# Patient Record
Sex: Male | Born: 1995
Health system: Southern US, Community
[De-identification: ages and names within clinical notes are randomized; demographics above are authoritative.]

## PROBLEM LIST (undated history)

## (undated) DIAGNOSIS — J302 Other seasonal allergic rhinitis: Secondary | ICD-10-CM

## (undated) DIAGNOSIS — J342 Deviated nasal septum: Secondary | ICD-10-CM

## (undated) DIAGNOSIS — J322 Chronic ethmoidal sinusitis: Secondary | ICD-10-CM

## (undated) DIAGNOSIS — J321 Chronic frontal sinusitis: Secondary | ICD-10-CM

## (undated) DIAGNOSIS — Z8489 Family history of other specified conditions: Secondary | ICD-10-CM

## (undated) DIAGNOSIS — Z98811 Dental restoration status: Secondary | ICD-10-CM

## (undated) DIAGNOSIS — J343 Hypertrophy of nasal turbinates: Secondary | ICD-10-CM

## (undated) DIAGNOSIS — H6690 Otitis media, unspecified, unspecified ear: Secondary | ICD-10-CM

## (undated) DIAGNOSIS — J320 Chronic maxillary sinusitis: Secondary | ICD-10-CM

## (undated) DIAGNOSIS — F819 Developmental disorder of scholastic skills, unspecified: Secondary | ICD-10-CM

## (undated) DIAGNOSIS — F909 Attention-deficit hyperactivity disorder, unspecified type: Secondary | ICD-10-CM

## (undated) DIAGNOSIS — J323 Chronic sphenoidal sinusitis: Secondary | ICD-10-CM

## (undated) HISTORY — PX: TYMPANOSTOMY TUBE PLACEMENT: SHX32

## (undated) HISTORY — PX: WISDOM TOOTH EXTRACTION: SHX21

---
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
## 2014-01-06 ENCOUNTER — Emergency Department (HOSPITAL_COMMUNITY)
Admission: EM | Admit: 2014-01-06 | Discharge: 2014-01-06 | Disposition: A | Discharge status: Home / Self Care | Payer: Worker's Compensation | Attending: Emergency Medicine | Admitting: Emergency Medicine

## 2014-01-06 ENCOUNTER — Encounter (HOSPITAL_COMMUNITY): Payer: Self-pay | Admitting: Emergency Medicine

## 2014-01-06 ENCOUNTER — Emergency Department (HOSPITAL_COMMUNITY): Payer: Worker's Compensation

## 2014-01-06 DIAGNOSIS — Y990 Civilian activity done for income or pay: Secondary | ICD-10-CM | POA: Diagnosis not present

## 2014-01-06 DIAGNOSIS — S8991XA Unspecified injury of right lower leg, initial encounter: Secondary | ICD-10-CM | POA: Diagnosis present

## 2014-01-06 DIAGNOSIS — W1839XA Other fall on same level, initial encounter: Secondary | ICD-10-CM | POA: Diagnosis not present

## 2014-01-06 DIAGNOSIS — Y9389 Activity, other specified: Secondary | ICD-10-CM | POA: Diagnosis not present

## 2014-01-06 DIAGNOSIS — Y9289 Other specified places as the place of occurrence of the external cause: Secondary | ICD-10-CM | POA: Insufficient documentation

## 2014-01-06 DIAGNOSIS — Z87891 Personal history of nicotine dependence: Secondary | ICD-10-CM | POA: Insufficient documentation

## 2014-01-06 DIAGNOSIS — X58XXXA Exposure to other specified factors, initial encounter: Secondary | ICD-10-CM | POA: Insufficient documentation

## 2014-01-06 DIAGNOSIS — M25461 Effusion, right knee: Secondary | ICD-10-CM | POA: Insufficient documentation

## 2014-01-06 MED ORDER — NAPROXEN 500 MG PO TABS: 500.0000 mg | ORAL_TABLET | Freq: Two times a day (BID) | ORAL | Status: DC

## 2014-01-06 NOTE — ED Provider Notes (Signed)
CSN: [id number]     Arrival date & time 01/06/14  2014 History   First MD Initiated Contact with Patient 01/06/14 2212     Chief Complaint  Patient presents with  . Knee Injury     (Consider location/radiation/quality/duration/timing/severity/associated sxs/prior Treatment) HPI Comments: Slipped at work twisting R knee at work just before arrival in ED  NO therapies PTA   The history is provided by the patient.    History reviewed. No pertinent past medical history. Past Surgical History  Procedure Laterality Date  . Shoulder surgery     No family history on file. History  Substance Use Topics  . Smoking status: Former Games developermoker  . Smokeless tobacco: Not on file  . Alcohol Use: Yes     Comment: occasionally     Review of Systems  Constitutional: Negative for fever.  Musculoskeletal: Positive for joint swelling.  Neurological: Negative for weakness and numbness.  All other systems reviewed and are negative.     Allergies  Review of patient's allergies indicates no known allergies.  Home Medications   Prior to Admission medications   Medication Sig Start Date End Date Taking? Authorizing Provider  naproxen (NAPROSYN) 500 MG tablet Take 1 tablet (500 mg total) by mouth 2 (two) times daily. 01/06/14   Arman FilterGail K Seanpatrick Maisano, NP   BP 148/92  Pulse 95  Temp(Src) 97.9 F (36.6 C) (Oral)  Resp 18  Ht 6' (1.829 m)  Wt 225 lb (102.059 kg)  BMI 30.51 kg/m2  SpO2 98% Physical Exam  Nursing note and vitals reviewed. Constitutional: He is oriented to person, place, and time. He appears well-developed and well-nourished.  HENT:  Head: Normocephalic.  Eyes: Pupils are equal, round, and reactive to light.  Neck: Normal range of motion.  Cardiovascular: Normal rate.   Pulmonary/Chest: Effort normal.  Musculoskeletal: Normal range of motion. He exhibits tenderness.       Legs: Neurological: He is alert and oriented to person, place, and time.  Skin: Skin is warm.    ED Course   Procedures (including critical care time) Labs Review Labs Reviewed - No data to display  Imaging Review Dg Knee Complete 4 Views Right  01/06/2014   CLINICAL DATA:  Right knee injury, fall, medial right knee pain.  EXAM: RIGHT KNEE - COMPLETE 4+ VIEW  COMPARISON:  None.  FINDINGS: No displaced fracture or dislocation. No aggressive osseous lesion or for degenerative change. Small knee joint effusion.  IMPRESSION: No acute or aggressive osseous finding of the right knee.  Small joint effusion.   Electronically Signed   By: Jearld LeschAndrew  DelGaizo M.D.   On: 01/06/2014 21:11     EKG Interpretation None      MDM   Final diagnoses:  Knee effusion, right          Arman FilterGail K Shayra Anton, NP 01/06/14 2239

## 2014-01-06 NOTE — ED Notes (Signed)
Ortho called 

## 2014-01-06 NOTE — ED Notes (Signed)
Pt presents with c/o right knee injury that occurred at work today. Pt reports that he fell at work and hurt the inside of his right knee. Pt reports that he believes this to be a workers comp case. Ambulatory to triage.

## 2014-01-06 NOTE — ED Provider Notes (Signed)
Medical screening examination/treatment/procedure(s) were performed by non-physician practitioner and as supervising physician I was immediately available for consultation/collaboration.   EKG Interpretation None       Kehlani Vancamp, MD 01/06/14 2330 

## 2014-01-06 NOTE — Discharge Instructions (Signed)
Knee Effusion  Knee effusion means you have fluid in your knee. The knee may be more difficult to bend and move. HOME CARE  Use crutches or a brace as told by your doctor.  Put ice on the injured area.  Put ice in a plastic bag.  Place a towel between your skin and the bag.  Leave the ice on for 15-20 minutes, 03-04 times a day.  Raise (elevate) your knee as much as possible.  Only take medicine as told by your doctor.  You may need to do strengthening exercises. Ask your doctor.  Continue with your normal diet and activities as told by your doctor. GET HELP RIGHT AWAY IF:  You have more puffiness (swelling) in your knee.  You see redness, puffiness, or have more pain in your knee.  You have a temperature by mouth above 102 F (38.9 C).  You get a rash.  You have trouble breathing.  You have a reaction to any medicine you are taking.  You have a lot of pain when you move your knee. MAKE SURE YOU:  Understand these instructions.  Will watch your condition.  Will get help right away if you are not doing well or get worse. Document Released: 04/01/2010 Document Revised: 05/22/2011 Document Reviewed: 04/01/2010 [facility] Patient Information 2015 [place]. This information is not intended to replace advice given to you by your health care provider. Make sure you discuss any questions you have with your health care provider. Follow up with Dr. Eulah PontMurphy as needed

## 2014-04-28 ENCOUNTER — Encounter (HOSPITAL_COMMUNITY): Payer: Self-pay | Admitting: Psychiatry

## 2014-04-28 ENCOUNTER — Ambulatory Visit (INDEPENDENT_AMBULATORY_CARE_PROVIDER_SITE_OTHER): Payer: BLUE CROSS/BLUE SHIELD | Admitting: Psychiatry

## 2014-04-28 ENCOUNTER — Encounter (INDEPENDENT_AMBULATORY_CARE_PROVIDER_SITE_OTHER): Payer: Self-pay

## 2014-04-28 VITALS — BP 120/80 | HR 82 | Ht 72.0 in | Wt 232.0 lb

## 2014-04-28 DIAGNOSIS — F39 Unspecified mood [affective] disorder: Secondary | ICD-10-CM | POA: Diagnosis not present

## 2014-04-28 DIAGNOSIS — F1421 Cocaine dependence, in remission: Secondary | ICD-10-CM | POA: Diagnosis not present

## 2014-04-28 DIAGNOSIS — H6980 Other specified disorders of Eustachian tube, unspecified ear: Secondary | ICD-10-CM | POA: Insufficient documentation

## 2014-04-28 DIAGNOSIS — F411 Generalized anxiety disorder: Secondary | ICD-10-CM | POA: Diagnosis not present

## 2014-04-28 DIAGNOSIS — G43909 Migraine, unspecified, not intractable, without status migrainosus: Secondary | ICD-10-CM | POA: Insufficient documentation

## 2014-04-28 MED ORDER — HYDROXYZINE HCL 25 MG PO TABS: 25.0000 mg | ORAL_TABLET | Freq: Two times a day (BID) | ORAL | Status: DC | PRN

## 2014-04-28 MED ORDER — GABAPENTIN 100 MG PO CAPS: 100.0000 mg | ORAL_CAPSULE | Freq: Three times a day (TID) | ORAL | Status: DC

## 2014-04-28 NOTE — Progress Notes (Signed)
Patient ID: Nicholas [name], male   DOB: 05/14/1995, 19 y.o.   MRN: [id number]  [facility]   Nicholas [name] [id number] 19 y.o.  04/28/2014 4:12 PM  Chief Complaint:  anger  History of Present Illness:   Patient Presents for Initial Evaluation with symptoms of mood swings. Patient is 19 years old currently single Caucasian male who is living with his grandparents who have helped raise them.  He was referred by Dr. Mariam DollarKearns his primary care physician. He recently visited more marked and was diagnosed with possible borderline personality disorder and also was given Abilify 2.5 mg for mood stabilization. He said that he had difficulty with some swallowing so he stopped it. He did not [facility] to [facility] as the day were hard to be reached on for him and to make another appointment  He mentions he has mood swings with no apparent reason at times. 2015 was tough for him because his love relationship ended in therapy 2015. He got involved with cocaine. His girlfriend came back to his life but he did she did on her. The relationship ended again. Also in the meantime he went to [place] to visit his dad. His dad went to prison so he ended up taking care of his younger brother that also presents them that he felt that was the reason that his dad involved him to come to [place]. He felt things have spiraled down in 2015 because of the above reason  Modifying factors; he was to become a Optometristmotorcycle mechanic and is going to do an associate degree and that helps him feel better he is also going to buy a motor bike for himself. He does have support of his family members and his friends.  Patient says that he has not used cocaine since December he understands that it can make his mood upset or more aggravated because he already has an anguish according to him he can get very impulsive and angry and at times it is difficult to control it. He has been in martial arts and  he loved the fact that he would get hit and hit other people.  He also endorses excessive worry sometimes unreasonable he feels muscle tightening and also to have difficulty sleeping at night.  There is no psychotic symptoms as well as hallucinations. There is no clear manic symptoms he does get impulsive or angry that last a few hours but not for days and days  He is a difficult childhood because he did not go to his parents his dad (the patient was 89 years of age. He grew up with his grandparents who technically the according to him he feels that there is parents. His grandmother had issues with depression and mood swings also is that according to him has issues with most things and depression     Past Psychiatric History/Hospitalization(s) [facility].  Hospitalization for psychiatric illness: No History of Electroconvulsive Shock Therapy: No Prior Suicide Attempts: No  Medical History; No past medical history on file.  Allergies: No Known Allergies  Medications: Outpatient Encounter Prescriptions as of 04/28/2014  Medication Sig  . methylPREDNISolone (MEDROL DOSEPAK) 4 MG tablet follow package directions  . montelukast (SINGULAIR) 10 MG tablet Take 10 mg by mouth.  . gabapentin (NEURONTIN) 100 MG capsule Take 1 capsule (100 mg total) by mouth 3 (three) times daily.  . hydrOXYzine (ATARAX/VISTARIL) 25 MG tablet Take 1 tablet (25 mg total) by mouth 2 (  two) times daily as needed.  . naproxen (NAPROSYN) 500 MG tablet Take 1 tablet (500 mg total) by mouth 2 (two) times daily.     Substance Abuse History: Alcohol used last was 1-1/2 ago prior to that he was using beer or wine. Says that he is not a regular drinker and drinks sporadically.  Regarding to cocaine use it was much regularly from October to December 2015 he has not used it since then Says that he has used marijuana for headaches or migraines but he has not uses more than once or twice last  few weeks  Family History; No family history on file. Father ; depression   Biopsychosocial History: He grew up with his grandparents. His dad left patient when patient was 73 years of age. His mom lives nearby but mostly grew up with his grandparents. He was emotionally kid didn't grow up with his grandparents did finish high school. But has a difficult childhood as he felt this something missing and also grandmother was harsh with him.  Recently has had lost relationship with his girlfriend and also had difficult time with his dad when he went to [place]. Currently does not have legal issues going on he wants to go back to college so that he can do motorcycle mechanics degree    Labs:  No results found for this or any previous visit (from the past 2160 hour(s)).     Musculoskeletal: Strength & Muscle Tone: within normal limits Gait & Station: normal Patient leans: N/A  Mental Status Examination;   Psychiatric Specialty Exam: Physical Exam  Constitutional: He appears well-developed and well-nourished. No distress.  Skin: He is not diaphoretic.    Review of Systems  Constitutional: Negative.   Cardiovascular: Negative for chest pain.  Gastrointestinal: Negative for nausea.  Skin: Negative for rash.  Neurological: Negative for tremors.  Psychiatric/Behavioral: The patient is nervous/anxious.     Blood pressure 120/80, pulse 82, height 6' (1.829 m), weight 232 lb (105.235 kg).Body mass index is 31.46 kg/(m^2).  General Appearance: Casual and Well Groomed  Patent attorney::  Fair  Speech:  Normal Rate  Volume:  Normal  Mood:  Anxious  Affect:  Constricted  Thought Process:  Coherent  Orientation:  Full (Time, Place, and Person)  Thought Content:  Rumination  Suicidal Thoughts:  No  Homicidal Thoughts:  No  Memory:  Immediate;   Fair Recent;   Fair  Judgement:  Fair  Insight:  Shallow  Psychomotor Activity:  Normal  Concentration:  Fair  Recall:  Fair  Akathisia:   Negative  Handed:  Right  AIMS (if indicated):     Assets:  Desire for Improvement Social Support  Sleep:        Assessment: Axis I: mood disorder unspecified. Generalized anxiety disorder. Cocaine use disorder, mild  in remission early  Axis II: Borderline personality traits  Axis III:  No past medical history on file.  Axis IV: psychosocial   Treatment Plan and Summary: Patient has had a difficult childhood still has difficulties adjusting to stress and has had a difficult last year. I would recommend psychotherapy. He is diagnosed possible borderline personality disorder. I would add a mood stabilizer gabapentin 100 mg to 3 times a day. Add Vistaril 25 mg once or twice for anxiety. He is advised not to continue Abilify he said he has already stopped for a while he was on 2.5 mg. Visit ER in case has further problems or swallowing issues.  Recommend therapy versus borderline  personality traits and psychosocial issues and regarding to difficulty dealing with losses and having a difficult childhood. He is again advised not to use any alcohol or marijuana or cocaine he understands that the medication would not work and his judejement and mood may be unpredictable.  Pertinent Labs and Relevant Prior Notes reviewed. Medication Side effects, benefits and risks reviewed/discussed with Patient. Time given for patient to respond and asks questions regarding the Diagnosis and Medications. Safety concerns and to report to ER if suicidal or call 911. Relevant Medications refilled or called in to pharmacy. Discussed weight maintenance and Sleep Hygiene. Follow up with Primary care provider in regards to Medical conditions. Recommend compliance with medications and follow up office appointments. Discussed to avail opportunity to consider or/and continue Individual therapy with Counselor. Greater than 50% of time was spend in counseling and coordination of care with the patient.  Schedule for  Follow up visit in 4 weeks or call in earlier as necessary.   Thresa Ross, MD 04/28/2014

## 2014-05-12 ENCOUNTER — Ambulatory Visit (INDEPENDENT_AMBULATORY_CARE_PROVIDER_SITE_OTHER): Payer: BLUE CROSS/BLUE SHIELD | Admitting: Psychiatry

## 2014-05-12 ENCOUNTER — Ambulatory Visit (INDEPENDENT_AMBULATORY_CARE_PROVIDER_SITE_OTHER): Payer: BLUE CROSS/BLUE SHIELD | Admitting: Licensed Clinical Social Worker

## 2014-05-12 ENCOUNTER — Encounter (HOSPITAL_COMMUNITY): Payer: Self-pay | Admitting: Psychiatry

## 2014-05-12 VITALS — BP 110/60 | HR 81 | Ht 72.0 in | Wt 233.0 lb

## 2014-05-12 DIAGNOSIS — F1420 Cocaine dependence, uncomplicated: Secondary | ICD-10-CM | POA: Diagnosis not present

## 2014-05-12 DIAGNOSIS — F411 Generalized anxiety disorder: Secondary | ICD-10-CM

## 2014-05-12 DIAGNOSIS — F39 Unspecified mood [affective] disorder: Secondary | ICD-10-CM

## 2014-05-12 DIAGNOSIS — F603 Borderline personality disorder: Secondary | ICD-10-CM

## 2014-05-12 MED ORDER — LAMOTRIGINE 25 MG PO TABS: 25.0000 mg | ORAL_TABLET | Freq: Every day | ORAL | Status: DC

## 2014-05-12 MED ORDER — ESCITALOPRAM OXALATE 10 MG PO TABS: 10.0000 mg | ORAL_TABLET | Freq: Every day | ORAL | Status: DC
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
## 2014-05-12 NOTE — Progress Notes (Signed)
Patient ID: Nicholas [name], male   DOB: 03/27/1995, 19 y.o.   MRN: [id number]  [facility]  Nicholas [name] [id number] 18 y.o.  05/12/2014 10:23 AM  Chief Complaint:  anger  History of Present Illness:   Patient Presents for follow up and medication management for Mood disorder and GAD. Patient is 19 years old currently single Caucasian male who is living with his grandparents who have helped raise them.  He was referred by Dr. Mariam DollarKearns his primary care physician. He recently [facility] and was diagnosed with possible borderline personality disorder and also was given Abilify 2.5 mg for mood stabilization. He said that he had difficulty with some swallowing so he stopped it. He did not [facility] to [facility] as the day were hard to be reached on for him and to make another appointment  Last visit started him on Neurontin and Vistaril. It did not help him much she still has some mood swings and difficulty and with anxiety at nighttime is not having those panic symptoms at night and says that he is pretty sure he does not have sleep apnea's primary care physician evaluated him but did not have been a sleep study yet.  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 He does have support of his family members and his friends.  Patient says that he has not used cocaine since December he understands that it can make his mood upset or more aggravated because he already has an anguish according to him he can get very impulsive and angry and at times it is difficult to control it. He has been in martial arts and he loved the fact that he would get hit and hit other people.  He also endorses excessive worry sometimes unreasonable he feels muscle tightening and also to have difficulty sleeping at night.  There is no psychotic symptoms  as well as hallucinations. There is no clear manic symptoms he does get impulsive or angry that last a few hours but not for days and days  He is a difficult childhood because he did not go to his parents his dad (the patient was 783 years of age. He grew up with his grandparents who technically the according to him he feels that there is parents. His grandmother had issues with depression and mood swings also is that according to him has issues with most things and depression     Past Psychiatric History/Hospitalization(s) [facility] with [facility] put on abilify.  Hospitalization for psychiatric illness: No History of Electroconvulsive Shock Therapy: No Prior Suicide Attempts: No  Medical History; No past medical history on file.  Allergies: Allergies  Allergen Reactions  . Other     Walnuts,tree nuts, fruit peeling    Medications: Outpatient Encounter Prescriptions as of 05/12/2014  Medication Sig  . EPINEPHrine 0.3 mg/0.3 mL IJ SOAJ injection   . hydrOXYzine (ATARAX/VISTARIL) 25 MG tablet Take 1 tablet (25 mg total) by mouth 2 (two) times daily as needed.  . methylPREDNISolone (MEDROL DOSEPAK) 4 MG tablet follow package directions  . montelukast (SINGULAIR) 10 MG tablet Take 10 mg by mouth.  . escitalopram (LEXAPRO) 10 MG tablet Take 1 tablet (10 mg total) by mouth daily.  Marland Kitchen. lamoTRIgine (LAMICTAL) 25 MG tablet Take 1 tablet (25 mg total) by mouth daily. Take one  tablet daily for a week and then start taking 2 tablets.  . naproxen (NAPROSYN) 500 MG tablet Take 1 tablet (500 mg total) by mouth 2 (two) times daily. (Patient not taking: Reported on 05/12/2014)  . [DISCONTINUED] gabapentin (NEURONTIN) 100 MG capsule Take 1 capsule (100 mg total) by mouth 3 (three) times daily. (Patient not taking: Reported on 05/12/2014)  . [DISCONTINUED] hydrOXYzine (ATARAX/VISTARIL) 25 MG tablet Take 25 mg by mouth.     Substance Abuse History: Alcohol used last was 1-1/2 ago prior to that he was  using beer or wine. Says that he is not a regular drinker and drinks sporadically.  Regarding to cocaine use it was much regularly from October to December 2015 he has not used it since then Says that he has used marijuana for headaches or migraines but he has not uses more than once or twice last few weeks  Family History; No family history on file. Father ; depression     Labs:  No results found for this or any previous visit (from the past 2160 hour(s)).     Musculoskeletal: Strength & Muscle Tone: within normal limits Gait & Station: normal Patient leans: N/A  Mental Status Examination;   Psychiatric Specialty Exam: Physical Exam  Constitutional: He appears well-developed and well-nourished. No distress.  Skin: He is not diaphoretic.    ROS  Blood pressure 110/60, pulse 81, height 6' (1.829 m), weight 233 lb (105.688 kg).Body mass index is 31.59 kg/(m^2).  General Appearance: Casual and Well Groomed  Patent attorney::  Fair  Speech:  Normal Rate  Volume:  Normal  Mood:  Anxious but not labile  Affect:  Constricted  Thought Process:  Coherent  Orientation:  Full (Time, Place, and Person)  Thought Content:  Rumination  Suicidal Thoughts:  No  Homicidal Thoughts:  No  Memory:  Immediate;   Fair Recent;   Fair  Judgement:  Fair  Insight:  Shallow  Psychomotor Activity:  Normal  Concentration:  Fair  Recall:  Fair  Akathisia:  Negative  Handed:  Right  AIMS (if indicated):     Assets:  Desire for Improvement Social Support  Sleep:        Assessment: Axis I: mood disorder unspecified. Generalized anxiety disorder. Cocaine use disorder, mild  in remission early  Axis II: Borderline personality traits  Axis III:  No past medical history on file.  Axis IV: psychosocial   Treatment Plan and Summary: Abilify caused him some swallowing issues at night although he was taking the morning dose. We will start him on mood stabilizer Lamictal 25 mg increase it to  50 mg He can increase further by next visit.  Regarding anxiety and panic symptoms will start him on lexapro 5 mg increase to 10 mg. He wanted to have some medication that would have immediate effect that I cautioned that they are addicting and we have tried Vistaril.  Recommend therapy versus borderline personality traits and psychosocial issues and regarding to difficulty dealing with losses and having a difficult childhood. He is again advised not to use any alcohol or marijuana or cocaine he understands that the medication would not work and his judejement and mood may be unpredictable.  Pertinent Labs and Relevant Prior Notes reviewed. Medication Side effects, benefits and risks reviewed/discussed with Patient. Time given for patient to respond and asks questions regarding the Diagnosis and Medications. Safety concerns and to report to ER if suicidal or call 911. Relevant Medications refilled or called in to pharmacy.  Discussed weight maintenance and Sleep Hygiene. Follow up with Primary care provider in regards to Medical conditions. Recommend compliance with medications and follow up office appointments. Discussed to avail opportunity to consider or/and continue Individual therapy with Counselor. Greater than 50% of time was spend in counseling and coordination of care with the patient.  Schedule for Follow up visit in 4 weeks or call in earlier as necessary.   Thresa Ross, MD 05/12/2014

## 2014-05-22 ENCOUNTER — Telehealth (HOSPITAL_COMMUNITY): Payer: Self-pay | Admitting: *Deleted

## 2014-05-22 NOTE — Telephone Encounter (Signed)
Pt called stating he is having some heartburn from taking Lexapro and Lamcital. Pt would like to speak to Dr. Gilmore LarocheAkhtar for medication changes.  L/M for pt to return call to office.

## 2014-05-26 ENCOUNTER — Encounter (HOSPITAL_COMMUNITY): Payer: Self-pay | Admitting: Psychiatry

## 2014-05-26 ENCOUNTER — Ambulatory Visit (INDEPENDENT_AMBULATORY_CARE_PROVIDER_SITE_OTHER): Payer: BLUE CROSS/BLUE SHIELD | Admitting: Psychiatry

## 2014-05-26 VITALS — BP 136/82 | HR 96 | Ht 72.0 in | Wt 233.0 lb

## 2014-05-26 DIAGNOSIS — F1421 Cocaine dependence, in remission: Secondary | ICD-10-CM

## 2014-05-26 DIAGNOSIS — F39 Unspecified mood [affective] disorder: Secondary | ICD-10-CM

## 2014-05-26 DIAGNOSIS — F603 Borderline personality disorder: Secondary | ICD-10-CM

## 2014-05-26 DIAGNOSIS — F411 Generalized anxiety disorder: Secondary | ICD-10-CM

## 2014-05-26 NOTE — Progress Notes (Signed)
Patient ID: Nicholas [name], male   DOB: 08/16/1995, 19 y.o.   MRN: [id number]  [facility]  Nicholas [name] [id number] 18 y.o.  05/26/2014 2:58 PM  Chief Complaint:  anger  History of Present Illness:   Patient Presents for follow up and medication management for Mood disorder and GAD. Patient is 19 years old currently single Caucasian male who is living with his grandparents who have helped raise them.  He was referred by Dr. Mariam DollarKearns his primary care physician. He recently [facility] and was diagnosed with possible borderline personality disorder and also was given Abilify 2.5 mg for mood stabilization. He said that he had difficulty with some swallowing so he stopped it. He did not want to go back [facility] as the day were hard to be reached on for him and to make another appointment  Last visit we started Lexapro and Lamictal apparently he was having stomach upset issues and reflects problems. Says that he was able to swallow them was not having swallowing issues but because of the reflects issues he had to stop them. Slowly stop by the next 3 days she started feeling better. Patient has not been using drinking or using other drugs he feels more clear less angry and less impulsive says that he wants to try on the therapy and avoid any medications for now.  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 He does have support of his family members and his friends.  Patient says that he has not used cocaine since December he understands that it can make his mood upset or more aggravated. He is not using cocaine and avoiding people who use drugs that has been helping also.   Less anxious and irritable. Says he wants to continue therpay only and avoid meds.  There is no psychotic symptoms as well as hallucinations. There is no  clear manic symptoms he does get impulsive or angry that last a few hours but not for days and days  He is a difficult childhood because he did not go to his parents his dad (the patient was 833 years of age. He grew up with his grandparents who technically the according to him he feels that there is parents. His grandmother had issues with depression and mood swings also is that according to him has issues with most things and depression     Past Psychiatric History/Hospitalization(s) [facility].  Hospitalization for psychiatric illness: No History of Electroconvulsive Shock Therapy: No Prior Suicide Attempts: No  Medical History; No past medical history on file.  Allergies: Allergies  Allergen Reactions  . Other     Walnuts,tree nuts, fruit peeling    Medications: Outpatient Encounter Prescriptions as of 05/26/2014  Medication Sig  . EPINEPHrine 0.3 mg/0.3 mL IJ SOAJ injection   . hydrOXYzine (ATARAX/VISTARIL) 25 MG tablet Take 1 tablet (25 mg total) by mouth 2 (two) times daily as needed.  . methylPREDNISolone (MEDROL DOSEPAK) 4 MG tablet follow package directions  . montelukast (SINGULAIR) 10 MG tablet Take 10 mg by mouth.  . naproxen (NAPROSYN) 500 MG tablet Take 1 tablet (500 mg total) by mouth 2 (two) times daily. (Patient not taking: Reported on 05/12/2014)  . [DISCONTINUED] escitalopram (LEXAPRO) 10 MG tablet Take 1 tablet (10 mg total) by mouth daily.  . [  DISCONTINUED] lamoTRIgine (LAMICTAL) 25 MG tablet Take 1 tablet (25 mg total) by mouth daily. Take one tablet daily for a week and then start taking 2 tablets.     Substance Abuse History: Alcohol used last was 1-1/2 ago prior to that he was using beer or wine. Says that he is not a regular drinker and drinks sporadically.  Regarding to cocaine use it was much regularly from October to December 2015 he has not used it since then Says that he has used marijuana for headaches or migraines but  he has not uses more than once or twice last few weeks  Family History; No family history on file. Father ; depression     Labs:  No results found for this or any previous visit (from the past 2160 hour(s)).     Musculoskeletal: Strength & Muscle Tone: within normal limits Gait & Station: normal Patient leans: N/A  Mental Status Examination;   Psychiatric Specialty Exam: Physical Exam  Constitutional: He appears well-developed and well-nourished. No distress.  Skin: He is not diaphoretic.    ROS  Blood pressure 136/82, pulse 96, height 6' (1.829 m), weight 233 lb (105.688 kg).Body mass index is 31.59 kg/(m^2).  General Appearance: Casual and Well Groomed  Patent attorney::  Fair  Speech:  Normal Rate  Volume:  Normal  Mood:  euthymic  Affect:  Constricted  Thought Process:  Coherent  Orientation:  Full (Time, Place, and Person)  Thought Content:  Rumination  Suicidal Thoughts:  No  Homicidal Thoughts:  No  Memory:  Immediate;   Fair Recent;   Fair  Judgement:  Fair  Insight:  Shallow  Psychomotor Activity:  Normal  Concentration:  Fair  Recall:  Fair  Akathisia:  Negative  Handed:  Right  AIMS (if indicated):     Assets:  Desire for Improvement Social Support  Sleep:        Assessment: Axis I: mood disorder unspecified. Generalized anxiety disorder. Cocaine use disorder, mild  in remission early  Axis II: Borderline personality traits  Axis III:  No past medical history on file.  Axis IV: psychosocial   Treatment Plan and Summary:  Is continue Lexapro and lithium suddenly stop. He is not endorsing significant anxiety. He still wants to work on his anger issues and impulsivity with a she will continue therapy as of now he does not want to be on any other mood stabilizers start any medication. He is not suicidal or homicidal either.   Recommend therapy to continue for borderline personality traits and psychosocial issues and regarding to difficulty  dealing with losses and having a difficult childhood. He is again advised not to use any alcohol or marijuana or cocaine he understands that the medication would not work and his judejement and mood may be unpredictable.  Pertinent Labs and Relevant Prior Notes reviewed. Medication Side effects, benefits and risks reviewed/discussed with Patient. Time given for patient to respond and asks questions regarding the Diagnosis and Medications. Safety concerns and to report to ER if suicidal or call 911. Relevant Medications refilled or called in to pharmacy. Discussed weight maintenance and Sleep Hygiene. Follow up with Primary care provider in regards to Medical conditions. Recommend compliance with medications and follow up office appointments. Discussed to avail opportunity to consider or/and continue Individual therapy with Counselor. Greater than 50% of time was spend in counseling and coordination of care with the patient.  Schedule for Follow up with counsellor. No follow up for medication management needed at  this time.    Thresa Ross, MD 05/26/2014

## 2014-05-28 ENCOUNTER — Ambulatory Visit (HOSPITAL_COMMUNITY): Payer: Self-pay | Admitting: Psychiatry

## 2014-06-11 ENCOUNTER — Encounter (INDEPENDENT_AMBULATORY_CARE_PROVIDER_SITE_OTHER): Payer: Self-pay

## 2014-06-11 ENCOUNTER — Ambulatory Visit (INDEPENDENT_AMBULATORY_CARE_PROVIDER_SITE_OTHER): Payer: BLUE CROSS/BLUE SHIELD | Admitting: Licensed Clinical Social Worker

## 2014-06-11 DIAGNOSIS — F603 Borderline personality disorder: Secondary | ICD-10-CM

## 2014-06-11 NOTE — Psych (Signed)
   THERAPIST PROGRESS NOTE  Session Time: 10:00am-10:55am  Participation Level: Active  Behavioral Response: Well GroomedAlertEuthymic  Type of Therapy: Individual Therapy  Treatment Goals addressed: Coping  Interventions: Treatment planning  Suicidal/Homicidal: Denied both  Therapist Interventions:  Gathered information about significant events and changes in mood and functioning since last seen almost [name] month ago.   Collaborated with patient to develop [name] goal for his treatment plan.  Provided [name] description of DBT interventions he can expect to be taught in therapy.  Summary: Reported anger has been less, not waking up pissed off as much.  Mentioned having [name] lot of "weird" dreams involving scenerios from the past and how things could have been if he made different choices.  Quit his job so that he can focus on his mental health.  No longer taking psych meds.   Talked about how he wants to get to [name] point where he can look at his past and accept what happened.   Indicated that he thought the DBT interventions could be helpful.  Plan: Will educate about Borderline Personality Disorder at next session.  Diagnosis: Borderline Personality Disorder    Darrin LuisSolomon, Nicholas Moye A, LCSW 06/11/2014

## 2014-06-16 ENCOUNTER — Ambulatory Visit (HOSPITAL_COMMUNITY): Payer: Self-pay | Admitting: Psychiatry

## 2014-06-18 ENCOUNTER — Ambulatory Visit (INDEPENDENT_AMBULATORY_CARE_PROVIDER_SITE_OTHER): Payer: BLUE CROSS/BLUE SHIELD | Admitting: Licensed Clinical Social Worker

## 2014-06-18 DIAGNOSIS — F603 Borderline personality disorder: Secondary | ICD-10-CM

## 2014-06-18 NOTE — Psych (Signed)
   THERAPIST PROGRESS NOTE  Session Time: 10:05am-11:00am  Participation Level: Active  Behavioral Response: Well GroomedAlertEuthymic  Type of Therapy: Individual Therapy  Treatment Goals addressed: Coping  Interventions: Psycho-education  Suicidal/Homicidal: Denied both  Therapist Interventions:  Reviewed symptoms of Borderline Personality Disorder.  Prompted patient to comment on how he could or could not relate to each of the symptoms.    Summary: Indicated he could relate to most of the symptoms in one way or another.  There were [name] few that he said he had more problems with in the past and over time he has figured out how to cope more effectively.          Plan: Will continue discussion about his diagnosis.    Diagnosis: Borderline Personality Disorder    Nicholas [name], Nicholas Wery A, LCSW 06/18/2014

## 2014-06-25 ENCOUNTER — Ambulatory Visit (INDEPENDENT_AMBULATORY_CARE_PROVIDER_SITE_OTHER): Payer: BLUE CROSS/BLUE SHIELD | Admitting: Licensed Clinical Social Worker

## 2014-06-25 DIAGNOSIS — F603 Borderline personality disorder: Secondary | ICD-10-CM | POA: Diagnosis not present

## 2014-06-25 NOTE — Psych (Signed)
   THERAPIST PROGRESS NOTE  Session Time: 10:00am-10:55am  Participation Level: Active  Behavioral Response: Well GroomedAlertEuthymic  Type of Therapy: Individual Therapy  Treatment Goals addressed: Coping  Interventions: DBT-mindfulness  Suicidal/Homicidal: Admits to passive HI towards ex-best friend, able to rationalize why acting on the thoughts would have negative consequences, denies SI  Therapist Interventions:  Introduced concept of mindfulness.  Explained how it can be helpful to practice during times when you catch yourself having unhelpful thoughts.  Discussed how you can choose to do [name] task in [name] mindful way.  Provided several examples of ways to practice mindfulness: focusing on your breathing, focusing on an object, focusing on body sensations, focusing on what you hear, etc.     Summary: Receptive to the skills presented.  Commented on how he thought it could be applied in his own life.  Acknowledged that practicing the skills could cause changes in the way he processes information.      Indicated he is fairly satisfied with how he has been coping with life stressors, with the exception of yesterday.  Reported learning that his ex-best friend crossed [name] line with his girlfriend several month ago when they were dating.  When his girlfriend disclosed this information he "blacked out" for approximately 15 minutes.  Admits that he had homicidal thoughts at the time.              Plan: Return in one week.  Will expand on mindfulness.  Diagnosis: Borderline Personality Disorder    Nicholas [name], Nicholas Laverdiere A, LCSW 06/25/2014

## 2014-07-02 ENCOUNTER — Ambulatory Visit (HOSPITAL_COMMUNITY): Payer: Self-pay | Admitting: Licensed Clinical Social Worker

## 2014-07-09 ENCOUNTER — Ambulatory Visit (HOSPITAL_COMMUNITY): Payer: Self-pay | Admitting: Licensed Clinical Social Worker

## 2014-07-16 ENCOUNTER — Ambulatory Visit (INDEPENDENT_AMBULATORY_CARE_PROVIDER_SITE_OTHER): Payer: BLUE CROSS/BLUE SHIELD | Admitting: Licensed Clinical Social Worker

## 2014-07-16 DIAGNOSIS — F603 Borderline personality disorder: Secondary | ICD-10-CM

## 2014-07-16 NOTE — Psych (Signed)
   THERAPIST PROGRESS NOTE  Session Time: 10:10-11am  Participation Level: Active  Behavioral Response: Well GroomedAlertEuthymic  Type of Therapy: Individual Therapy  Treatment Goals addressed: Coping  Interventions: Supportive and solution-focused  Suicidal/Homicidal: Denied both  Therapist Interventions:  Explored how patient has improved the way he copes with urges to act in situations when he feels there is injustice.     Provided feedback about the way he coped with certain stressful situations.    Talked about forgiveness and the fact that when you don't forgive you are giving the other person power.   Asked about use of mindfulness skills.    Summary:      Described how he has resisted acting on urges to question his girlfriend about some decisions she made in the past.  Also resisted urges to confront a friend of his girlfriend who had been saying hurtful things about him.   Identified several coping strategies he has found to be helpful.  He has greatly reduced his use of social media in order to reduce potential for interpersonal conflict.  He has been posting on a website for individuals with BPD.  Indicated it has been helpful to connect with some people who experience similar moods and behaviors.  Reported that he has remembered to apply principles of mindfulness in an effort to achieve emotion regulation. Looking forward to starting school on May 16th.  Planning to do prerequisites for MRI technology.  Also excited about training with an expert in MMA fighting.    Expressed a preference to have sessions be less education based and more about processing significant events.  Said he would be glad to read any materials the therapist would provide on his own and then they can discuss how he is incorporating the skills.   Plan: Scheduled to return in one to two weeks.  Will introduce distress tolerance.  (Handout 13.1 in workbook Treating Survivors of Childhood Abuse)       Diagnosis: Borderline Personality Disorder    Darrin LuisSolomon, Sarah A, LCSW 07/16/2014

## 2014-07-23 ENCOUNTER — Ambulatory Visit (HOSPITAL_COMMUNITY): Payer: Self-pay | Admitting: Licensed Clinical Social Worker

## 2014-07-30 ENCOUNTER — Ambulatory Visit (HOSPITAL_COMMUNITY): Payer: Self-pay | Admitting: Licensed Clinical Social Worker

## 2014-07-31 ENCOUNTER — Ambulatory Visit (HOSPITAL_COMMUNITY): Payer: Self-pay | Admitting: Licensed Clinical Social Worker

## 2014-08-06 ENCOUNTER — Ambulatory Visit (HOSPITAL_COMMUNITY): Payer: Self-pay | Admitting: Licensed Clinical Social Worker

## 2014-09-04 ENCOUNTER — Ambulatory Visit (HOSPITAL_COMMUNITY): Payer: Self-pay | Admitting: Licensed Clinical Social Worker

## 2014-09-25 ENCOUNTER — Ambulatory Visit (HOSPITAL_COMMUNITY): Payer: Self-pay | Admitting: Licensed Clinical Social Worker

## 2014-10-09 ENCOUNTER — Ambulatory Visit (HOSPITAL_COMMUNITY): Payer: Self-pay | Admitting: Licensed Clinical Social Worker

## 2015-01-15 DIAGNOSIS — F411 Generalized anxiety disorder: Secondary | ICD-10-CM | POA: Insufficient documentation

## 2015-03-11 HISTORY — PX: SHOULDER ARTHROSCOPY W/ BANKART PROCEDURE: SHX2397

## 2015-09-26 DIAGNOSIS — E669 Obesity, unspecified: Secondary | ICD-10-CM | POA: Insufficient documentation

## 2015-09-26 DIAGNOSIS — Z72821 Inadequate sleep hygiene: Secondary | ICD-10-CM | POA: Insufficient documentation

## 2016-01-09 DIAGNOSIS — T17300A Unspecified foreign body in larynx causing asphyxiation, initial encounter: Secondary | ICD-10-CM | POA: Diagnosis not present

## 2016-01-09 DIAGNOSIS — T17928A Food in respiratory tract, part unspecified causing other injury, initial encounter: Secondary | ICD-10-CM | POA: Diagnosis not present

## 2016-01-09 DIAGNOSIS — J690 Pneumonitis due to inhalation of food and vomit: Secondary | ICD-10-CM | POA: Diagnosis not present

## 2016-01-09 DIAGNOSIS — X58XXXA Exposure to other specified factors, initial encounter: Secondary | ICD-10-CM | POA: Diagnosis not present

## 2016-01-25 DIAGNOSIS — M545 Low back pain: Secondary | ICD-10-CM | POA: Diagnosis not present

## 2016-01-25 DIAGNOSIS — M25522 Pain in left elbow: Secondary | ICD-10-CM | POA: Diagnosis not present

## 2016-01-25 DIAGNOSIS — M9907 Segmental and somatic dysfunction of upper extremity: Secondary | ICD-10-CM | POA: Diagnosis not present

## 2016-01-25 DIAGNOSIS — M9903 Segmental and somatic dysfunction of lumbar region: Secondary | ICD-10-CM | POA: Diagnosis not present

## 2016-01-27 DIAGNOSIS — M9903 Segmental and somatic dysfunction of lumbar region: Secondary | ICD-10-CM | POA: Diagnosis not present

## 2016-01-27 DIAGNOSIS — M25522 Pain in left elbow: Secondary | ICD-10-CM | POA: Diagnosis not present

## 2016-01-27 DIAGNOSIS — M545 Low back pain: Secondary | ICD-10-CM | POA: Diagnosis not present

## 2016-01-27 DIAGNOSIS — F411 Generalized anxiety disorder: Secondary | ICD-10-CM | POA: Diagnosis not present

## 2016-01-27 DIAGNOSIS — M9907 Segmental and somatic dysfunction of upper extremity: Secondary | ICD-10-CM | POA: Diagnosis not present

## 2016-02-01 DIAGNOSIS — M9907 Segmental and somatic dysfunction of upper extremity: Secondary | ICD-10-CM | POA: Diagnosis not present

## 2016-02-01 DIAGNOSIS — M25522 Pain in left elbow: Secondary | ICD-10-CM | POA: Diagnosis not present

## 2016-02-01 DIAGNOSIS — M545 Low back pain: Secondary | ICD-10-CM | POA: Diagnosis not present

## 2016-02-01 DIAGNOSIS — M9903 Segmental and somatic dysfunction of lumbar region: Secondary | ICD-10-CM | POA: Diagnosis not present

## 2016-02-17 DIAGNOSIS — F411 Generalized anxiety disorder: Secondary | ICD-10-CM | POA: Diagnosis not present

## 2016-02-29 DIAGNOSIS — M25511 Pain in right shoulder: Secondary | ICD-10-CM | POA: Diagnosis not present

## 2016-02-29 DIAGNOSIS — M25311 Other instability, right shoulder: Secondary | ICD-10-CM | POA: Diagnosis not present

## 2016-03-02 DIAGNOSIS — M9903 Segmental and somatic dysfunction of lumbar region: Secondary | ICD-10-CM | POA: Diagnosis not present

## 2016-03-02 DIAGNOSIS — F411 Generalized anxiety disorder: Secondary | ICD-10-CM | POA: Diagnosis not present

## 2016-03-02 DIAGNOSIS — M9907 Segmental and somatic dysfunction of upper extremity: Secondary | ICD-10-CM | POA: Diagnosis not present

## 2016-03-02 DIAGNOSIS — M25522 Pain in left elbow: Secondary | ICD-10-CM | POA: Diagnosis not present

## 2016-03-02 DIAGNOSIS — M545 Low back pain: Secondary | ICD-10-CM | POA: Diagnosis not present

## 2016-03-16 DIAGNOSIS — F411 Generalized anxiety disorder: Secondary | ICD-10-CM | POA: Diagnosis not present

## 2016-03-16 DIAGNOSIS — M9907 Segmental and somatic dysfunction of upper extremity: Secondary | ICD-10-CM | POA: Diagnosis not present

## 2016-03-16 DIAGNOSIS — M25522 Pain in left elbow: Secondary | ICD-10-CM | POA: Diagnosis not present

## 2016-03-16 DIAGNOSIS — M9903 Segmental and somatic dysfunction of lumbar region: Secondary | ICD-10-CM | POA: Diagnosis not present

## 2016-03-16 DIAGNOSIS — M545 Low back pain: Secondary | ICD-10-CM | POA: Diagnosis not present

## 2016-03-20 DIAGNOSIS — F411 Generalized anxiety disorder: Secondary | ICD-10-CM | POA: Diagnosis not present

## 2016-04-06 DIAGNOSIS — Z23 Encounter for immunization: Secondary | ICD-10-CM | POA: Diagnosis not present

## 2016-04-11 DIAGNOSIS — F411 Generalized anxiety disorder: Secondary | ICD-10-CM | POA: Diagnosis not present

## 2016-04-25 DIAGNOSIS — F411 Generalized anxiety disorder: Secondary | ICD-10-CM | POA: Diagnosis not present

## 2016-05-05 DIAGNOSIS — Z0000 Encounter for general adult medical examination without abnormal findings: Secondary | ICD-10-CM | POA: Diagnosis not present

## 2016-05-05 DIAGNOSIS — E669 Obesity, unspecified: Secondary | ICD-10-CM | POA: Diagnosis not present

## 2016-05-05 DIAGNOSIS — R5381 Other malaise: Secondary | ICD-10-CM | POA: Diagnosis not present

## 2016-05-05 DIAGNOSIS — Z202 Contact with and (suspected) exposure to infections with a predominantly sexual mode of transmission: Secondary | ICD-10-CM | POA: Diagnosis not present

## 2016-05-05 DIAGNOSIS — R5383 Other fatigue: Secondary | ICD-10-CM | POA: Diagnosis not present

## 2016-05-05 DIAGNOSIS — B001 Herpesviral vesicular dermatitis: Secondary | ICD-10-CM | POA: Diagnosis not present

## 2016-05-05 DIAGNOSIS — Z23 Encounter for immunization: Secondary | ICD-10-CM | POA: Diagnosis not present

## 2016-06-26 DIAGNOSIS — F411 Generalized anxiety disorder: Secondary | ICD-10-CM | POA: Diagnosis not present

## 2016-07-11 DIAGNOSIS — F411 Generalized anxiety disorder: Secondary | ICD-10-CM | POA: Diagnosis not present

## 2016-08-02 DIAGNOSIS — F411 Generalized anxiety disorder: Secondary | ICD-10-CM | POA: Diagnosis not present

## 2016-09-12 DIAGNOSIS — F411 Generalized anxiety disorder: Secondary | ICD-10-CM | POA: Diagnosis not present

## 2016-10-18 DIAGNOSIS — N3000 Acute cystitis without hematuria: Secondary | ICD-10-CM | POA: Diagnosis not present

## 2016-10-18 DIAGNOSIS — R590 Localized enlarged lymph nodes: Secondary | ICD-10-CM | POA: Diagnosis not present

## 2016-10-23 DIAGNOSIS — K529 Noninfective gastroenteritis and colitis, unspecified: Secondary | ICD-10-CM | POA: Diagnosis not present

## 2016-11-14 DIAGNOSIS — F909 Attention-deficit hyperactivity disorder, unspecified type: Secondary | ICD-10-CM | POA: Diagnosis not present

## 2016-11-14 DIAGNOSIS — J029 Acute pharyngitis, unspecified: Secondary | ICD-10-CM | POA: Diagnosis not present

## 2016-11-25 DIAGNOSIS — J019 Acute sinusitis, unspecified: Secondary | ICD-10-CM | POA: Diagnosis not present

## 2016-11-25 DIAGNOSIS — H6691 Otitis media, unspecified, right ear: Secondary | ICD-10-CM | POA: Diagnosis not present

## 2016-12-08 DIAGNOSIS — F411 Generalized anxiety disorder: Secondary | ICD-10-CM | POA: Diagnosis not present

## 2016-12-26 DIAGNOSIS — F411 Generalized anxiety disorder: Secondary | ICD-10-CM | POA: Diagnosis not present

## 2016-12-29 DIAGNOSIS — F419 Anxiety disorder, unspecified: Secondary | ICD-10-CM | POA: Diagnosis not present

## 2017-04-18 DIAGNOSIS — M25311 Other instability, right shoulder: Secondary | ICD-10-CM | POA: Diagnosis not present

## 2017-04-18 DIAGNOSIS — M25511 Pain in right shoulder: Secondary | ICD-10-CM | POA: Diagnosis not present

## 2017-04-25 DIAGNOSIS — M25511 Pain in right shoulder: Secondary | ICD-10-CM | POA: Diagnosis not present

## 2017-04-25 DIAGNOSIS — M25311 Other instability, right shoulder: Secondary | ICD-10-CM | POA: Diagnosis not present

## 2017-04-25 DIAGNOSIS — M7581 Other shoulder lesions, right shoulder: Secondary | ICD-10-CM | POA: Diagnosis not present

## 2017-07-11 DIAGNOSIS — Z202 Contact with and (suspected) exposure to infections with a predominantly sexual mode of transmission: Secondary | ICD-10-CM | POA: Diagnosis not present

## 2017-07-28 DIAGNOSIS — H6503 Acute serous otitis media, bilateral: Secondary | ICD-10-CM | POA: Diagnosis not present

## 2017-07-28 DIAGNOSIS — J019 Acute sinusitis, unspecified: Secondary | ICD-10-CM | POA: Diagnosis not present

## 2017-08-12 DIAGNOSIS — K219 Gastro-esophageal reflux disease without esophagitis: Secondary | ICD-10-CM | POA: Diagnosis not present

## 2017-09-02 DIAGNOSIS — H6692 Otitis media, unspecified, left ear: Secondary | ICD-10-CM | POA: Diagnosis not present

## 2017-09-07 ENCOUNTER — Ambulatory Visit: Payer: BLUE CROSS/BLUE SHIELD | Admitting: Physician Assistant

## 2017-09-07 ENCOUNTER — Other Ambulatory Visit: Payer: Self-pay

## 2017-09-07 ENCOUNTER — Encounter: Payer: Self-pay | Admitting: Physician Assistant

## 2017-09-07 VITALS — BP 122/86 | HR 96 | Temp 99.3°F | Resp 20 | Ht 72.99 in | Wt 254.4 lb

## 2017-09-07 DIAGNOSIS — F39 Unspecified mood [affective] disorder: Secondary | ICD-10-CM | POA: Diagnosis not present

## 2017-09-07 DIAGNOSIS — F411 Generalized anxiety disorder: Secondary | ICD-10-CM

## 2017-09-07 DIAGNOSIS — R41840 Attention and concentration deficit: Secondary | ICD-10-CM

## 2017-09-07 DIAGNOSIS — R451 Restlessness and agitation: Secondary | ICD-10-CM | POA: Diagnosis not present

## 2017-09-07 DIAGNOSIS — R4589 Other symptoms and signs involving emotional state: Secondary | ICD-10-CM

## 2017-09-07 MED ORDER — QUETIAPINE FUMARATE 25 MG PO TABS: 25.0000 mg | ORAL_TABLET | Freq: Every day | ORAL | 0 refills | Status: DC

## 2017-09-07 NOTE — Patient Instructions (Addendum)
For depression and anxiety, I recommend we start a daily seroquel and weekly/monthly therapy. Please keep in mind that when you start these medications it can worsen your depression and anxiety symptoms. It can also increase risk of suicidal thoughts so if this occurs, please seek care immediately. Common side effects of this medication include, but are not limited to, drowsiness, increased blood pressure, increased heart rate, GI upset, nausea, vomiting, muscle aches, and joint pains. Below is some information for local therapists. I recommend you reach out to one of these therapists before our next visit.Plan to return to clinic in 1 week for reevaluation of symptoms.   I have placed a referral for attention specialists they should contact you within 1-2 weeks.     For therapy -- [facility] ([address] Hulan Amato [place], Heather Joycelyn Schmid [place]) - [phone] [facility] [place]) - [phone] [facility] - [phone] [facility] - [phone] Buena Irish - [phone] [facility]  - [phone] (do not file insurance)     Quetiapine tablets What is this medicine? QUETIAPINE (kwe TYE a peen) is an antipsychotic. It is used to treat schizophrenia and bipolar disorder, also known as manic-depression. This medicine may be used for other purposes; ask your health care provider or pharmacist if you have questions. COMMON BRAND NAME(S): Seroquel What should I tell my health care provider before I take this medicine? They need to know if you have any of these conditions: -brain tumor or head injury -breast cancer -cataracts -diabetes -difficulty swallowing -heart disease -kidney disease -liver disease -low blood counts, like low white cell, platelet, or red cell counts -low blood pressure or dizziness when standing up -Parkinson's disease -previous heart  attack -seizures -suicidal thoughts, plans, or attempt by you or a family member -thyroid disease -an unusual or allergic reaction to quetiapine, other medicines, foods, dyes, or preservatives -pregnant or trying to get pregnant -breast-feeding How should I use this medicine? Take this medicine by mouth. Swallow it with a drink of water. Follow the directions on the prescription label. If it upsets your stomach you can take it with food. Take your medicine at regular intervals. Do not take it more often than directed. Do not stop taking except on the advice of your doctor or health care professional. A special MedGuide will be given to you by the pharmacist with each prescription and refill. Be sure to read this information carefully each time. Talk to your pediatrician regarding the use of this medicine in children. While this drug may be prescribed for children as young as 10 years for selected conditions, precautions do apply. Patients over age 33 years may have a stronger reaction to this medicine and need smaller doses. Overdosage: If you think you have taken too much of this medicine contact a poison control center or emergency room at once. NOTE: This medicine is only for you. Do not share this medicine with others. What if I miss a dose? If you miss a dose, take it as soon as you can. If it is almost time for your next dose, take only that dose. Do not take double or extra doses. What may interact with this medicine? Do not take this medicine with any of the following medications: -certain medicines for fungal infections like fluconazole, itraconazole, ketoconazole, posaconazole, voriconazole -cisapride -dofetilide -dronedarone -droperidol -grepafloxacin -halofantrine -phenothiazines like chlorpromazine, mesoridazine, thioridazine -pimozide -sparfloxacin -ziprasidone This medicine may also interact with the following medications: -alcohol -antiviral  medicines for HIV or  AIDS -certain medicines for blood pressure -certain medicines for depression, anxiety, or psychotic disturbances like haloperidol, lorazepam -certain medicines for diabetes -certain medicines for Parkinson's disease -certain medicines for seizures like carbamazepine, phenobarbital, phenytoin -cimetidine -erythromycin -other medicines that prolong the QT interval (cause an abnormal heart rhythm) -rifampin -steroid medicines like prednisone or cortisone This list may not describe all possible interactions. Give your health care provider a list of all the medicines, herbs, non-prescription drugs, or dietary supplements you use. Also tell them if you smoke, drink alcohol, or use illegal drugs. Some items may interact with your medicine. What should I watch for while using this medicine? Visit your doctor or health care professional for regular checks on your progress. It may be several weeks before you see the full effects of this medicine. Your health care provider may suggest that you have your eyes examined prior to starting this medicine, and every 6 months thereafter. If you have been taking this medicine regularly for some time, do not suddenly stop taking it. You must gradually reduce the dose or your symptoms may get worse. Ask your doctor or health care professional for advice. Patients and their families should watch out for worsening depression or thoughts of suicide. Also watch out for sudden or severe changes in feelings such as feeling anxious, agitated, panicky, irritable, hostile, aggressive, impulsive, severely restless, overly excited and hyperactive, or not being able to sleep. If this happens, especially at the beginning of antidepressant treatment or after a change in dose, call your health care professional. Bonita QuinYou may get dizzy or drowsy. Do not drive, use machinery, or do anything that needs mental alertness until you know how this medicine affects you. Do not stand or sit up  quickly, especially if you are an older patient. This reduces the risk of dizzy or fainting spells. Alcohol can increase dizziness and drowsiness. Avoid alcoholic drinks. Do not treat yourself for colds, diarrhea or allergies. Ask your doctor or health care professional for advice, some ingredients may increase possible side effects. This medicine can reduce the response of your body to heat or cold. Dress warm in cold weather and stay hydrated in hot weather. If possible, avoid extreme temperatures like saunas, hot tubs, very hot or cold showers, or activities that can cause dehydration such as vigorous exercise. What side effects may I notice from receiving this medicine? Side effects that you should report to your doctor or health care professional as soon as possible: -allergic reactions like skin rash, itching or hives, swelling of the face, lips, or tongue -difficulty swallowing -fast or irregular heartbeat -fever or chills, sore throat -fever with rash, swollen lymph nodes, or swelling of the face -increased hunger or thirst -increased urination -problems with balance, talking, walking -seizures -stiff muscles -suicidal thoughts or other mood changes -uncontrollable head, mouth, neck, arm, or leg movements -unusually weak or tired Side effects that usually do not require medical attention (report to your doctor or health care professional if they continue or are bothersome): -change in sex drive or performance -constipation -drowsy or dizzy -dry mouth -stomach upset -weight gain This list may not describe all possible side effects. Call your doctor for medical advice about side effects. You may report side effects to FDA at 1-800-FDA-1088. Where should I keep my medicine? Keep out of the reach of children. Store at room temperature between 15 and 30 degrees C (59 and 86 degrees F). Throw away any unused medicine after the expiration date.  NOTE: This sheet is a summary. It may not  cover all possible information. If you have questions about this medicine, talk to your doctor, pharmacist, or health care provider.  2018 Elsevier/Gold Standard (2014-09-01 13:07:35)  IF you received an x-ray today, you will receive an invoice from [facility]. Please contact [facility] at [phone] with questions or concerns regarding your invoice.   IF you received labwork today, you will receive an invoice from [facility]. Please contact [facility] at [phone] with questions or concerns regarding your invoice.   Our billing staff will not be able to assist you with questions regarding bills from these companies.  You will be contacted with the lab results as soon as they are available. The fastest way to get your results is to activate your My Chart account. Instructions are located on the last page of this paperwork. If you have not heard from [place] regarding the results in 2 weeks, please contact this office.

## 2017-09-07 NOTE — Progress Notes (Signed)
Nicholas [name]  MRN: [id number] DOB: 1995-09-08  Subjective:  Nicholas [name] is a 22 y.o. male seen in office today for a chief complaint of establishing care and mental health issues.  Patient's prior PCP has retired and he needs to establish care here.Not on any chronic daily medication at this time.  Extensive history  of mental health issues, which were being managed by his primary care. Was evaluated by psych last in 2016.   Does not know what his exact dx is.  He has been told he has GAD vs PTSD vs borderline personality disorder: meds tried-> 1)zoloft: worked but caused decreased libido, refuses use of SSRI due to this, 2) lexapro: didn't work 3) buspar and 4) atarax: doesn't remember 5) abilify: cannot remomeber And irritability.  Denies Reports having a very difficult childhood and not getting over some of the things that he went through.  He endorses excessive worrying, trouble relaxing, dysphoric mood, agitation, decreased need for sleep, impulsive behaviors, hallucinations, suicidal ideations, homicidal ideations.  Also been dx with ADHD vs dyslexia? Neuro seemed to think it was one or the other. Used to take vyvanse and this helped. If not on it, easily distracted. Never had issues as a child with focus. Issues with concentration started in college. Never had attention testing.  In class and get his assignments done due to his anxiety and inability to focus.  He is wanting a prescription for Adderall.  He basically comes in today because he just started grad school.  He is a history major.  He has not been able to focus. Wants prescription for Adderall.  . Last had Rx for adderall in 12/2016.   Has not established with a therapist here. Former tobacco user. Drinks alcohol regularly.  Used to use cocaine.  Has not done so in "a while."  Review of Systems  Constitutional: Negative for chills, fatigue and fever.  Respiratory: Negative for cough.   Cardiovascular: Negative for chest pain  and palpitations.  Gastrointestinal: Negative for nausea and vomiting.  Neurological: Negative for dizziness and headaches.    Patient Active Problem List   Diagnosis Date Noted  . Dysfunction of eustachian tube 04/28/2014  . Headache, migraine 04/28/2014  . Pain in shoulder 10/22/2012    Current Outpatient Medications on File Prior to Visit  Medication Sig Dispense Refill  . amoxicillin (AMOXIL) 875 MG tablet Take 875 mg by mouth 2 (two) times daily.    Marland Kitchen EPINEPHrine 0.3 mg/0.3 mL IJ SOAJ injection     . hydrOXYzine (ATARAX/VISTARIL) 25 MG tablet Take 1 tablet (25 mg total) by mouth 2 (two) times daily as needed. (Patient not taking: Reported on 09/07/2017) 30 tablet 0  . methylPREDNISolone (MEDROL DOSEPAK) 4 MG tablet follow package directions    . montelukast (SINGULAIR) 10 MG tablet Take 10 mg by mouth.    . naproxen (NAPROSYN) 500 MG tablet Take 1 tablet (500 mg total) by mouth 2 (two) times daily. (Patient not taking: Reported on 05/12/2014) 30 tablet 0  . [DISCONTINUED] escitalopram (LEXAPRO) 10 MG tablet Take 1 tablet (10 mg total) by mouth daily. 30 tablet 0  . [DISCONTINUED] lamoTRIgine (LAMICTAL) 25 MG tablet Take 1 tablet (25 mg total) by mouth daily. Take one tablet daily for a week and then start taking 2 tablets. 60 tablet 0   No current facility-administered medications on file prior to visit.     Allergies  Allergen Reactions  . Other     Walnuts,tree nuts, fruit peeling  Social History   Socioeconomic History  . Marital status: Single    Spouse name: Not on file  . Number of children: 0  . Years of education: Not on file  . Highest education level: Not on file  Occupational History  . Not on file  Social Needs  . Financial resource strain: Not on file  . Food insecurity:    Worry: Not on file    Inability: Not on file  . Transportation needs:    Medical: Not on file    Non-medical: Not on file  Tobacco Use  . Smoking status: Former Smoker    Last  attempt to quit: 02/07/2017    Years since quitting: 0.5  . Smokeless tobacco: Never Used  Substance and Sexual Activity  . Alcohol use: Yes    Alcohol/week: 6.0 oz    Types: 1 Glasses of wine, 7 Cans of beer, 2 Shots of liquor per week  . Drug use: No  . Sexual activity: Yes    Birth control/protection: None  Lifestyle  . Physical activity:    Days per week: Not on file    Minutes per session: Not on file  . Stress: Not on file  Relationships  . Social connections:    Talks on phone: Not on file    Gets together: Not on file    Attends religious service: Not on file    Active member of club or organization: Not on file    Attends meetings of clubs or organizations: Not on file    Relationship status: Not on file  . Intimate partner violence:    Fear of current or ex partner: Not on file    Emotionally abused: Not on file    Physically abused: Not on file    Forced sexual activity: Not on file  Other Topics Concern  . Not on file  Social History Narrative  . Not on file    Objective:  BP 122/86 (BP Location: Right Arm, Patient Position: Sitting, Cuff Size: Large)   Pulse 96   Temp 99.3 F (37.4 C) (Oral)   Resp 20   Ht 6' 0.99" (1.854 m)   Wt 254 lb 6.4 oz (115.4 kg)   SpO2 97%   BMI 33.57 kg/m    Physical Exam  Constitutional: He is oriented to person, place, and time. He appears well-developed and well-nourished. No distress.  HENT:  Head: Normocephalic and atraumatic.  Eyes: Conjunctivae are normal.  Neck: Normal range of motion.  Cardiovascular: Normal rate, regular rhythm, normal heart sounds and intact distal pulses.  Pulmonary/Chest: Effort normal and breath sounds normal. He has no wheezes. He has no rales.  Neurological: He is alert and oriented to person, place, and time.  Skin: Skin is warm and dry.  Psychiatric: He has a normal mood and affect.  Vitals reviewed.       GAD 7 : Generalized Anxiety Score 09/07/2017  Nervous, Anxious, on Edge 3    Control/stop worrying 2  Worry too much - different things 2  Trouble relaxing 3  Restless 3  Easily annoyed or irritable 3  Afraid - awful might happen 0  Total GAD 7 Score 16  Anxiety Difficulty Very difficult   Depression screen PHQ 2/9 09/07/2017  Decreased Interest 0  Down, Depressed, Hopeless 1  PHQ - 2 Score 1  Altered sleeping 3  Tired, decreased energy 2  Change in appetite 2  Feeling bad or failure about yourself  2  Trouble  concentrating 3  Moving slowly or fidgety/restless 1  Suicidal thoughts 1  PHQ-9 Score 15  Difficult doing work/chores Somewhat difficult    Mood disorder questionnaire with positive screen for possible bipolar disorder. Assessment and Plan :  This case was precepted with Dr. Pamella Pert.   1. Unable to concentrate - Ambulatory referral to Psychiatry  2. Anxiety state - Ambulatory referral to Psychiatry - Ambulatory referral to Psychiatry  3. Agitation - Ambulatory referral to Psychiatry - CMP14+EGFR - Lipid panel - CBC with Differential/Platelet - TSH - Ambulatory referral to Psychiatry  4. Dysphoric moodd - Ambulatory referral to Psychiatry - Ambulatory referral to Psychiatry  5. Mood disorder Patient has extensive history of mood disorder.  No SI or HI. Per chart review, it appears the patient has struggled to obtain a clear diagnosis.  Has obtained diagnoses of GAD, PTSD, borderline personality disorder, and ADHD.  Has had multiple medications in the past.  Has not followed up with psychiatry in over 3 years.  Today, GAD score of 16, PHQ score of 15, and positive screen for possible bipolar disorder with mood questionnaire.  Recommend further evaluation by psychiatry and attention specialist.  While awaiting referral, will attempt trial of low dose Seroquel at this time.  Encouraged to decrease alcohol consumption especially while on medication.  Educated on potential side effects of alcohol consumption and use of medication.   Follow-up in 1-week for reevaluation. -QUEtiapine (SEROQUEL) 25 MG tablet; Take 1 tablet (25 mg total) by mouth at bedtime.  Dispense: 30 tablet; Refill: 0 - Ambulatory referral to Psychiatry - Ambulatory referral to Psychiatry -[facility] Controlled Substance Database Registry was reviewed and is consistent with patient's history.  Side effects, risks, benefits, and alternatives of the medications and treatment plan prescribed today were discussed, and patient expressed understanding of the instructions given. No barriers to understanding were identified. Red flags discussed in detail. Pt expressed understanding regarding what to do in case of emergency/urgent symptoms.   A total of 45 minutes was spent in the room with the patient, greater than 50% of which was in counseling/coordination of care regarding mood disorder.  Tenna Delaine PA-C  [facility] 09/07/2017 3:24 PM

## 2017-09-08 LAB — CBC WITH DIFFERENTIAL/PLATELET
Basophils Absolute: 0 10*3/uL (ref 0.0–0.2)
Basos: 0 %
EOS (ABSOLUTE): 0.3 10*3/uL (ref 0.0–0.4)
EOS: 4 %
HEMATOCRIT: 49 % (ref 37.5–51.0)
Hemoglobin: 16.6 g/dL (ref 13.0–17.7)
Immature Grans (Abs): 0 10*3/uL (ref 0.0–0.1)
Immature Granulocytes: 0 %
Lymphocytes Absolute: 1.9 10*3/uL (ref 0.7–3.1)
Lymphs: 28 %
MCH: 31.1 pg (ref 26.6–33.0)
MCHC: 33.9 g/dL (ref 31.5–35.7)
MCV: 92 fL (ref 79–97)
Monocytes Absolute: 0.7 10*3/uL (ref 0.1–0.9)
Monocytes: 10 %
Neutrophils Absolute: 3.9 10*3/uL (ref 1.4–7.0)
Neutrophils: 58 %
Platelets: 278 10*3/uL (ref 150–450)
RBC: 5.33 x10E6/uL (ref 4.14–5.80)
RDW: 13.1 % (ref 12.3–15.4)
WBC: 6.8 10*3/uL (ref 3.4–10.8)

## 2017-09-08 LAB — CMP14+EGFR
A/G RATIO: 2.2 (ref 1.2–2.2)
ALBUMIN: 4.9 g/dL (ref 3.5–5.5)
ALT: 23 IU/L (ref 0–44)
AST: 15 IU/L (ref 0–40)
Alkaline Phosphatase: 58 IU/L (ref 39–117)
BILIRUBIN TOTAL: 0.4 mg/dL (ref 0.0–1.2)
BUN/Creatinine Ratio: 11 (ref 9–20)
BUN: 13 mg/dL (ref 6–20)
CO2: 26 mmol/L (ref 20–29)
Calcium: 10 mg/dL (ref 8.7–10.2)
Chloride: 100 mmol/L (ref 96–106)
Creatinine, Ser: 1.2 mg/dL (ref 0.76–1.27)
GFR calc non Af Amer: 85 mL/min/{1.73_m2} (ref >59)
GFR, EST AFRICAN AMERICAN: 99 mL/min/{1.73_m2} (ref >59)
Globulin, Total: 2.2 g/dL (ref 1.5–4.5)
Glucose: 105 mg/dL — ABNORMAL HIGH (ref 65–99)
Potassium: 4.8 mmol/L (ref 3.5–5.2)
Sodium: 139 mmol/L (ref 134–144)
Total Protein: 7.1 g/dL (ref 6.0–8.5)

## 2017-09-08 LAB — LIPID PANEL
Chol/HDL Ratio: 3.7 ratio (ref 0.0–5.0)
Cholesterol, Total: 178 mg/dL (ref 100–199)
HDL: 48 mg/dL (ref >39)
LDL Calculated: 105 mg/dL — ABNORMAL HIGH (ref 0–99)
Triglycerides: 126 mg/dL (ref 0–149)
VLDL Cholesterol Cal: 25 mg/dL (ref 5–40)

## 2017-09-08 LAB — TSH: TSH: 1.54 u[IU]/mL (ref 0.450–4.500)

## 2017-09-10 NOTE — Progress Notes (Signed)
Letter sent.

## 2017-09-11 DIAGNOSIS — J343 Hypertrophy of nasal turbinates: Secondary | ICD-10-CM | POA: Diagnosis not present

## 2017-09-11 DIAGNOSIS — J342 Deviated nasal septum: Secondary | ICD-10-CM | POA: Diagnosis not present

## 2017-09-11 DIAGNOSIS — H900 Conductive hearing loss, bilateral: Secondary | ICD-10-CM | POA: Diagnosis not present

## 2017-09-11 DIAGNOSIS — J330 Polyp of nasal cavity: Secondary | ICD-10-CM | POA: Diagnosis not present

## 2017-09-11 DIAGNOSIS — H6983 Other specified disorders of Eustachian tube, bilateral: Secondary | ICD-10-CM | POA: Diagnosis not present

## 2017-09-17 ENCOUNTER — Other Ambulatory Visit (INDEPENDENT_AMBULATORY_CARE_PROVIDER_SITE_OTHER): Payer: Self-pay | Admitting: Otolaryngology

## 2017-09-17 DIAGNOSIS — J329 Chronic sinusitis, unspecified: Secondary | ICD-10-CM

## 2017-09-24 ENCOUNTER — Ambulatory Visit: Payer: BLUE CROSS/BLUE SHIELD | Admitting: Physician Assistant

## 2017-09-24 ENCOUNTER — Other Ambulatory Visit: Payer: Self-pay

## 2017-09-24 ENCOUNTER — Encounter: Payer: Self-pay | Admitting: Physician Assistant

## 2017-09-24 VITALS — BP 118/82 | HR 91 | Temp 99.6°F | Resp 20 | Ht 72.64 in | Wt 255.0 lb

## 2017-09-24 DIAGNOSIS — F39 Unspecified mood [affective] disorder: Secondary | ICD-10-CM

## 2017-09-24 DIAGNOSIS — E669 Obesity, unspecified: Secondary | ICD-10-CM

## 2017-09-24 MED ORDER — QUETIAPINE FUMARATE 50 MG PO TABS: 50.0000 mg | ORAL_TABLET | Freq: Every day | ORAL | 0 refills | Status: DC

## 2017-09-24 NOTE — Progress Notes (Signed)
Nicholas BelfastDaniel Crissman  MRN: [id number] DOB: 05/26/1995  Subjective:  Nicholas [name] is a 22 y.o. male seen in office today for a chief complaint of follow-up of mood disorder.  Patient was last seen on 09/07/2017.  Due to complexity of symptoms, he was treated with Seroquel.  Referral was placed for psychiatry and attention specialist.  Patient has not heard from the referral yet.  In terms of symptoms, he notes he feels like a completely different person.  His inability to concentrate, dysphoric mood, constant anxiety, and irritability have all significantly improved.  He is sleeping well.  Will only worry about things occasionally.  Making a 100 and his class but notes that it does take him longer to do his work than the typical person.  Still feels a little bit irritable.  Has noticed a more vivid dreams with use of the medication.  Denies hallucinations, hypotension, hypertension, increased heart rate, dizziness, drowsiness, confusion, jerky movements, myalgias, muscle twitches, weight gain, suicidal and homicidal ideation.  He has cut back significantly on the amount of alcohol he consumes.  Patient would also like referral to nutritionist.  He is interested in obtaining a meal plan that will assist with weight loss.  He currently eats a high protein diet but feels like this is contributing to weight gain.  He is not currently exercising because he does not want to gain muscle and in term gain weight.  He is to see a nutritionist in [place] and had great success with this.  No other questions or concerns today.  Review of Systems Per HPI Patient Active Problem List   Diagnosis Date Noted  . Obesity (BMI 30.0-34.9) 09/26/2015  . Poor sleep hygiene 09/26/2015  . Generalized anxiety disorder 01/15/2015  . Dysfunction of eustachian tube 04/28/2014  . Pain in shoulder 10/22/2012  . Migraine with aura 11/22/2011  . Seasonal allergic conjunctivitis 03/21/2011    Current Outpatient Medications on  File Prior to Visit  Medication Sig Dispense Refill  . QUEtiapine (SEROQUEL) 25 MG tablet Take 1 tablet (25 mg total) by mouth at bedtime. 30 tablet 0  . [DISCONTINUED] escitalopram (LEXAPRO) 10 MG tablet Take 1 tablet (10 mg total) by mouth daily. 30 tablet 0  . [DISCONTINUED] lamoTRIgine (LAMICTAL) 25 MG tablet Take 1 tablet (25 mg total) by mouth daily. Take one tablet daily for a week and then start taking 2 tablets. 60 tablet 0   No current facility-administered medications on file prior to visit.     Allergies  Allergen Reactions  . Tramadol Other (See Comments) and Hives    Hallucinations Hallucinations   . Other     Walnuts,tree nuts, fruit peeling     Objective:  BP 118/82 (BP Location: Right Arm, Patient Position: Sitting, Cuff Size: Large)   Pulse (!) 113   Temp 99.6 F (37.6 C) (Oral)   Resp 20   Ht 6' 0.64" (1.845 m)   Wt 255 lb (115.7 kg)   SpO2 97%   BMI 33.98 kg/m   Physical Exam  Constitutional: He is oriented to person, place, and time. He appears well-developed and well-nourished. No distress.  HENT:  Head: Normocephalic and atraumatic.  Eyes: Conjunctivae are normal.  Neck: Normal range of motion.  Cardiovascular: Normal rate, regular rhythm and normal heart sounds.  Pulmonary/Chest: Effort normal.  Neurological: He is alert and oriented to person, place, and time.  Skin: Skin is warm and dry.  Psychiatric: He has a normal mood and affect.  Vitals reviewed.  BP Readings from Last 3 Encounters:  09/24/17 118/82  09/07/17 122/86  01/06/14 148/92   Wt Readings from Last 3 Encounters:  09/24/17 255 lb (115.7 kg)  09/07/17 254 lb 6.4 oz (115.4 kg)  01/06/14 225 lb (102.1 kg) (98 %, Z= 2.00)*   * Growth percentiles are based on CDC (Boys, 2-20 Years) data.     GAD 7 : Generalized Anxiety Score 09/24/2017 09/07/2017  Nervous, Anxious, on Edge 1 3  Control/stop worrying 1 2  Worry too much - different things 1 2  Trouble relaxing 2 3    Restless 2 3  Easily annoyed or irritable 2 3  Afraid - awful might happen 0 0  Total GAD 7 Score 9 16  Anxiety Difficulty Very difficult Very difficult    Depression screen [facility] 2/9 09/24/2017 09/07/2017  Decreased Interest 0 0  Down, Depressed, Hopeless 1 1  PHQ - 2 Score 1 1  Altered sleeping 1 3  Tired, decreased energy 1 2  Change in appetite 1 2  Feeling bad or failure about yourself  0 2  Trouble concentrating 2 3  Moving slowly or fidgety/restless 1 1  Suicidal thoughts 0 1  PHQ-9 Score 7 15  Difficult doing work/chores Somewhat difficult Somewhat difficult    Assessment and Plan :  1. Mood disorder [facility]) Patient has tolerated medication well. Vitals stable.  His symptoms have significantly improved.  He is on a low dose.  Recommend increasing dose to 50 mg at bedtime to see if he can gain additional benefit in terms of anxiety, irritability, concentration.  Will check on referrals for psychiatry.  Plan to follow-up here in 6 weeks.  We will do CPE at that time and obtain fasting labs.  - QUEtiapine (SEROQUEL) 50 MG tablet; Take 1 tablet (50 mg total) by mouth at bedtime.  Dispense: 90 tablet; Refill: 0  2. Obesity (BMI 30.0-34.9) Recommended continue with low carb and high-protein diet.  May benefit from use of carb counting app.  Educated patient that he would likely benefit from endurance exercises and nonweightbearing aerobic exercises.  Given contact information for local nutritionists in town.  Referral placed for weight management but  inform patient that could take quite some time to get in with them as they patient is okay with this.  Typically have a weight loss. - Amb Ref to Medical Weight Management  A total of 25 minutes was spent in the room with the patient, greater than 50% of which was in counseling/coordination of care regarding mood disorder and obesity.   Benjiman Core PA-C  [facility] at [facility] 09/24/2017 2:22 PM

## 2017-09-24 NOTE — Patient Instructions (Addendum)
Increase Seroquel dose to 50 mg daily.  Monitor for any side effects.  Return in 6 weeks for complete physical exam and reevaluation.  I have placed a referral for weight management but it may take some time.  Below are some local nutritionists/ dietitians you can contact. Nutritionists and Dietitians in [place], [place]   Christie P Hunter  Nutritionist/Dietitian, [place], [place], LDN  [phone]    Carie Caddyaroline Thomason  Nutritionist/Dietitian, RD, LDN  [phone]  Office is near: [place], [place] [phone]  View Email    Arlina Robesnna C Hallock  Nutritionist/Dietitian, [place], LDN  [phone]  Office [place] near: [place], [place] [phone]  View Email    Jill Brown  Nutritionist/Dietitian, MS, RDN, [place], CLT  [phone]   Janene Madeirana Rohler  Nutritionist/Dietitian, RDN  [phone]  Office [place] near: [place], [place] [phone]  IF you received an x-ray today, you will receive an invoice from [facility]. Please contact [facility] at [phone] with questions or concerns regarding your invoice.   IF you received labwork today, you will receive an invoice from [facility]. Please contact [facility] at [phone] with questions or concerns regarding your invoice.   Our billing staff will not be able to assist you with questions regarding bills from these companies.  You will be contacted with the lab results as soon as they are available. The fastest way to get your results is to activate your My Chart account. Instructions are located on the last page of this paperwork. If you have not heard from us regarding the results in 2 weeks, please contact this office.

## 2017-09-25 ENCOUNTER — Encounter: Payer: Self-pay | Admitting: Physician Assistant

## 2017-09-26 ENCOUNTER — Telehealth: Payer: Self-pay | Admitting: Physician Assistant

## 2017-09-26 NOTE — Telephone Encounter (Signed)
Left vm for pt to return my call concerning psychiatry referrals. Wanted to let pt know one referral has been sent to [facility] and he can call them at [phone] to schedule. The other has been sent to [facility], and he can reach them at [phone].

## 2017-09-30 DIAGNOSIS — H9202 Otalgia, left ear: Secondary | ICD-10-CM | POA: Diagnosis not present

## 2017-09-30 DIAGNOSIS — R42 Dizziness and giddiness: Secondary | ICD-10-CM | POA: Diagnosis not present

## 2017-10-12 ENCOUNTER — Ambulatory Visit
Admission: RE | Admit: 2017-10-12 | Discharge: 2017-10-12 | Disposition: A | Discharge status: Home / Self Care | Payer: BLUE CROSS/BLUE SHIELD | Source: Ambulatory Visit | Attending: Otolaryngology | Admitting: Otolaryngology

## 2017-10-12 ENCOUNTER — Other Ambulatory Visit: Payer: Self-pay

## 2017-10-12 DIAGNOSIS — J329 Chronic sinusitis, unspecified: Secondary | ICD-10-CM

## 2017-10-12 DIAGNOSIS — J3489 Other specified disorders of nose and nasal sinuses: Secondary | ICD-10-CM | POA: Diagnosis not present

## 2017-10-17 ENCOUNTER — Encounter: Payer: Self-pay | Admitting: Physician Assistant

## 2017-10-22 DIAGNOSIS — F902 Attention-deficit hyperactivity disorder, combined type: Secondary | ICD-10-CM | POA: Diagnosis not present

## 2017-10-22 DIAGNOSIS — R41840 Attention and concentration deficit: Secondary | ICD-10-CM | POA: Diagnosis not present

## 2017-10-22 DIAGNOSIS — Z79899 Other long term (current) drug therapy: Secondary | ICD-10-CM | POA: Diagnosis not present

## 2017-10-24 ENCOUNTER — Ambulatory Visit: Payer: BLUE CROSS/BLUE SHIELD | Admitting: Physician Assistant

## 2017-10-25 ENCOUNTER — Encounter: Payer: Self-pay | Admitting: Physician Assistant

## 2017-11-03 DIAGNOSIS — B009 Herpesviral infection, unspecified: Secondary | ICD-10-CM | POA: Diagnosis not present

## 2017-11-07 ENCOUNTER — Ambulatory Visit: Payer: BLUE CROSS/BLUE SHIELD | Admitting: Physician Assistant

## 2017-11-07 ENCOUNTER — Encounter: Payer: Self-pay | Admitting: Physician Assistant

## 2017-11-07 ENCOUNTER — Other Ambulatory Visit: Payer: Self-pay

## 2017-11-07 VITALS — BP 127/86 | HR 101 | Temp 98.7°F | Resp 20 | Ht 72.64 in | Wt 251.0 lb

## 2017-11-07 DIAGNOSIS — R21 Rash and other nonspecific skin eruption: Secondary | ICD-10-CM

## 2017-11-07 MED ORDER — VALACYCLOVIR HCL 1 G PO TABS: 1000.0000 mg | ORAL_TABLET | Freq: Three times a day (TID) | ORAL | 0 refills | Status: AC

## 2017-11-07 MED ORDER — PREDNISONE 10 MG PO TABS: ORAL_TABLET | ORAL | 0 refills | Status: DC

## 2017-11-07 NOTE — Progress Notes (Signed)
Nicholas [name]  MRN: [id number] DOB: 1995/07/01  Subjective:  Nicholas [name] is a 22 y.o. male seen in office today for a chief complaint of rash involving the face. Rash started 4 days ago. Started in his nose after being sick with a head cold. They then progressed to his forehead and lower lip. Had associated pain, burning, and tingling. Went to an urgent care 2 days ago and was given a shot of prednisone and acyclovir tablets 4x a day for 2 days. He finished the acyclovir course and is concerned because he has not seen much improvement in the rash. It is near his right eye, but no eye involvement. Denies ear involvement, visual disturbance, fever, chills nausea, vomiting, and malaise.  Of note, patient reports that he cannot stop touching the lesions and continues to mess with them.  Has PMH of nasal HSV. Reports this happens most of the time if he gets really sick or stressed out. No PMH of HIV or diabetes. No other questions or concerns.    Review of Systems  Per HPI  Patient Active Problem List   Diagnosis Date Noted  . Obesity (BMI 30.0-34.9) 09/26/2015  . Poor sleep hygiene 09/26/2015  . Generalized anxiety disorder 01/15/2015  . Dysfunction of eustachian tube 04/28/2014  . Pain in shoulder 10/22/2012  . Migraine with aura 11/22/2011  . Seasonal allergic conjunctivitis 03/21/2011    Current Outpatient Medications on File Prior to Visit  Medication Sig Dispense Refill  . QUEtiapine (SEROQUEL) 50 MG tablet Take 1 tablet (50 mg total) by mouth at bedtime. 90 tablet 0  . [DISCONTINUED] escitalopram (LEXAPRO) 10 MG tablet Take 1 tablet (10 mg total) by mouth daily. 30 tablet 0  . [DISCONTINUED] lamoTRIgine (LAMICTAL) 25 MG tablet Take 1 tablet (25 mg total) by mouth daily. Take one tablet daily for a week and then start taking 2 tablets. 60 tablet 0   No current facility-administered medications on file prior to visit.     Allergies  Allergen Reactions  . Tramadol Other (See  Comments) and Hives    Hallucinations Hallucinations   . Other     Walnuts,tree nuts, fruit peeling     Objective:  BP 127/86 (BP Location: Right Arm, Patient Position: Sitting, Cuff Size: Large)   Pulse (!) 101   Temp 98.7 F (37.1 C) (Oral)   Resp 20   Ht 6' 0.64" (1.845 m)   Wt 251 lb (113.9 kg)   SpO2 97%   BMI 33.45 kg/m   Physical Exam  Constitutional: He is oriented to person, place, and time. He appears well-developed and well-nourished. No distress.  HENT:  Head: Normocephalic and atraumatic.  Right Ear: External ear normal.  Left Ear: External ear normal.  Eyes: Pupils are equal, round, and reactive to light. Conjunctivae and EOM are normal.  Neck: Normal range of motion.  Pulmonary/Chest: Effort normal.  Neurological: He is alert and oriented to person, place, and time.  Skin: Skin is warm and dry. Rash (scaly patch on forehead, erythematous papules on right cheek and tip of nose, few crusted erythematous patches in right nostril, vescular lesion on right lower lip, one erythematous vesicle on distal dorsum aspect of right index finger) noted.  Psychiatric: He has a normal mood and affect.  Vitals reviewed.      Assessment and Plan :  1. Rash and nonspecific skin eruption Unclear etiology due to distribution as it crosses the midline and crosses multiple dermatomes. However, due to hx and appearance  will treat as HSV or viral variant with valtrex. Cx pending. Rec pred taper due to proximity to eye. No eye or ear involvement noted at this time. Advised to return to clinic if symptoms worsen, do not improve, or as needed.  - Herpes simplex virus culture - predniSONE (DELTASONE) 10 MG tablet; 6-5-4-3-2-1 taper. Take all the tablets for that day in the am with food.  Dispense: 21 tablet; Refill: 0 - valACYclovir (VALTREX) 1000 MG tablet; Take 1 tablet (1,000 mg total) by mouth 3 (three) times daily for 7 days.  Dispense: 21 tablet; Refill: 0   Benjiman CoreBrittany Errika Narvaiz  PA-C  Primary Care at [facility]  [facility] 11/07/2017 7:06 PM

## 2017-11-07 NOTE — Patient Instructions (Signed)
° ° ° °  If you have lab work done today you will be contacted with your lab results within the next 2 weeks.  If you have not heard from us then please contact us. The fastest way to get your results is to register for My Chart. ° ° °IF you received an x-ray today, you will receive an invoice from [facility]. Please contact [facility] at [phone] with questions or concerns regarding your invoice.  ° °IF you received labwork today, you will receive an invoice from [facility]. Please contact [facility] at [phone] with questions or concerns regarding your invoice.  ° °Our billing staff will not be able to assist you with questions regarding bills from these companies. ° °You will be contacted with the lab results as soon as they are available. The fastest way to get your results is to activate your My Chart account. Instructions are located on the last page of this paperwork. If you have not heard from us regarding the results in 2 weeks, please contact this office. °  ° ° ° °

## 2017-11-10 LAB — HERPES SIMPLEX VIRUS CULTURE

## 2017-11-11 ENCOUNTER — Encounter: Payer: Self-pay | Admitting: Physician Assistant

## 2017-11-12 ENCOUNTER — Encounter: Payer: Self-pay | Admitting: Physician Assistant

## 2017-11-14 ENCOUNTER — Telehealth: Payer: Self-pay | Admitting: Physician Assistant

## 2017-11-14 DIAGNOSIS — J342 Deviated nasal septum: Secondary | ICD-10-CM | POA: Diagnosis not present

## 2017-11-14 DIAGNOSIS — J320 Chronic maxillary sinusitis: Secondary | ICD-10-CM | POA: Diagnosis not present

## 2017-11-14 DIAGNOSIS — J338 Other polyp of sinus: Secondary | ICD-10-CM | POA: Diagnosis not present

## 2017-11-14 DIAGNOSIS — J321 Chronic frontal sinusitis: Secondary | ICD-10-CM | POA: Diagnosis not present

## 2017-11-14 DIAGNOSIS — H6523 Chronic serous otitis media, bilateral: Secondary | ICD-10-CM | POA: Diagnosis not present

## 2017-11-14 DIAGNOSIS — H900 Conductive hearing loss, bilateral: Secondary | ICD-10-CM | POA: Diagnosis not present

## 2017-11-14 DIAGNOSIS — H6983 Other specified disorders of Eustachian tube, bilateral: Secondary | ICD-10-CM | POA: Diagnosis not present

## 2017-11-14 NOTE — Telephone Encounter (Signed)
Copied from CRM [id number]. Topic: Quick Communication - See Telephone Encounter >> Nov 14, 2017 11:28 AM Jolayne Haines L wrote: CRM for notification. See Telephone encounter for: 11/14/17.  Patient states that he was given valACYclovir (VALTREX) 1000 MG tablet by Benjiman Core on 8/28. No Improvement.   He is stating that his girlfriend has the same rash as him and she was treated today and he was told by that doctor to ask his primary care physician to give him " CEPHALEXIN 500 mg " & " MUPIROCIN 2% 22 grams" he said he is not seeing any improvement with the Valtrex. She tested positive for Imptigo. Call back @ [phone]  [id number] [place], [place] - [address] AT [facility] OF [facility] [address] [place] [address]

## 2017-11-15 NOTE — Progress Notes (Signed)
Nicholas [name]  MRN: [id number] DOB: [birth year]  Subjective:  Nicholas [name] is a 22 y.o. male seen in office today for a chief complaint of follow-up on rash. Patient was last seen on 11/07/2017.  He has prior history of nasal HSV and was very convinced that that was what was happening due to his prior outbreaks.  Due to appearance, treated empirically for HSV with Valtrex and prednisone.  Obtained HSV culture, which was negative.  Today, patient reports that his girlfriend ended up getting the same rash on her face.  She went to a doctor and her swab came back positive for impetigo.  She was treated with Keflex and mupirocin and her rash has began to heal.  He started using her mupirocin ointment and notes that his rash has improved.  Still has one lesion on his forehead and one on his forearm.  Denies fever, chills, nausea, vomiting, and visual disturbance.  No other questions or concerns.   Review of Systems  Per HPI  Patient Active Problem List   Diagnosis Date Noted  . Obesity (BMI 30.0-34.9) 09/26/2015  . Poor sleep hygiene 09/26/2015  . Generalized anxiety disorder 01/15/2015  . Dysfunction of eustachian tube 04/28/2014  . Pain in shoulder 10/22/2012  . Migraine with aura 11/22/2011  . Seasonal allergic conjunctivitis 03/21/2011    Current Outpatient Medications on File Prior to Visit  Medication Sig Dispense Refill  . amphetamine-dextroamphetamine (ADDERALL XR) 20 MG 24 hr capsule Take 20 mg by mouth daily.    . [DISCONTINUED] escitalopram (LEXAPRO) 10 MG tablet Take 1 tablet (10 mg total) by mouth daily. 30 tablet 0   No current facility-administered medications on file prior to visit.     Allergies  Allergen Reactions  . Tramadol Other (See Comments) and Hives    Hallucinations Hallucinations   . Other     Walnuts,tree nuts, fruit peeling     Objective:  BP 122/72   Pulse (!) 108   Temp 98 F (36.7 C) (Oral)   Resp 18   Ht 6' 0.84" (1.85 m)   Wt 245 lb (111.1  kg)   SpO2 97%   BMI 32.47 kg/m   Physical Exam  Constitutional: He is oriented to person, place, and time. He appears well-developed and well-nourished.  HENT:  Head: Normocephalic and atraumatic.  Eyes: Conjunctivae are normal.  Neck: Normal range of motion.  Pulmonary/Chest: Effort normal.  Neurological: He is alert and oriented to person, place, and time.  Skin: Skin is warm and dry. Rash (Erythematous scaly patch on forehead and right forearm, lesion on forehead improved since intial visit ) noted.  Psychiatric: He has a normal mood and affect.  Vitals reviewed.      Wt Readings from Last 3 Encounters:  11/16/17 245 lb (111.1 kg)  11/07/17 251 lb (113.9 kg)  09/24/17 255 lb (115.7 kg)     Assessment and Plan :  1. Impetigo Pt has clearly responded well to topical mupirocin ointment.  He is only applied it once daily for the past few days.  Recommend mupirocin ointment topically 3 times a day for the next 10 days.  Do not think he needs oral antibiotics at this time due to the improvement with only a few days of mupirocin ointment.  However, given Rx for oral prescription if rash does not fully heal in 10 days or begins to worsen.  Given strict return precautions. - cephALEXin (KEFLEX) 500 MG capsule; Take 1 capsule (500 mg total) by  mouth 3 (three) times daily for 7 days.  Dispense: 21 capsule; Refill: 0 - mupirocin ointment (BACTROBAN) 2 %; Apply 1 application topically 3 (three) times daily.  Dispense: 22 g; Refill: 1  2. HSV (herpes simplex virus) infection -Provided patient with refills for Valtrex to use the future for recurrences of HSV infection. - valACYclovir (VALTREX) 1000 MG tablet; Take 2 tablets by mouth every 12 hours x 1 day, Start ASAP after sx onset.  Dispense: 20 tablet; Refill: 1  3. Need for Tdap vaccination - Tdap vaccine greater than or equal to 7yo IM    Benjiman Core PA-C  Primary Care at [facility] 11/16/2017 11:37  AM

## 2017-11-15 NOTE — Telephone Encounter (Signed)
Message re: ? imptigo - message sent to [place] - pt has appt tomorrow

## 2017-11-16 ENCOUNTER — Encounter: Payer: Self-pay | Admitting: Physician Assistant

## 2017-11-16 ENCOUNTER — Ambulatory Visit: Payer: BLUE CROSS/BLUE SHIELD | Admitting: Physician Assistant

## 2017-11-16 ENCOUNTER — Other Ambulatory Visit: Payer: Self-pay

## 2017-11-16 VITALS — BP 122/72 | HR 108 | Temp 98.0°F | Resp 18 | Ht 72.84 in | Wt 245.0 lb

## 2017-11-16 DIAGNOSIS — Z79899 Other long term (current) drug therapy: Secondary | ICD-10-CM | POA: Diagnosis not present

## 2017-11-16 DIAGNOSIS — L0100 Impetigo, unspecified: Secondary | ICD-10-CM

## 2017-11-16 DIAGNOSIS — Z23 Encounter for immunization: Secondary | ICD-10-CM | POA: Diagnosis not present

## 2017-11-16 DIAGNOSIS — B009 Herpesviral infection, unspecified: Secondary | ICD-10-CM

## 2017-11-16 DIAGNOSIS — F902 Attention-deficit hyperactivity disorder, combined type: Secondary | ICD-10-CM | POA: Diagnosis not present

## 2017-11-16 MED ORDER — VALACYCLOVIR HCL 1 G PO TABS: ORAL_TABLET | ORAL | 1 refills | Status: DC

## 2017-11-16 MED ORDER — CEPHALEXIN 500 MG PO CAPS: 500.0000 mg | ORAL_CAPSULE | Freq: Three times a day (TID) | ORAL | 0 refills | Status: AC

## 2017-11-16 MED ORDER — MUPIROCIN 2 % EX OINT
1.0000 | TOPICAL_OINTMENT | Freq: Three times a day (TID) | CUTANEOUS | 1 refills | Status: DC
Start: 2017-11-16 — End: 2017-12-31

## 2017-11-16 NOTE — Patient Instructions (Addendum)
For facial rash, I have given you prescription for mupirocin ointment.  Start using 3 times a day for the next 10 days.  If no improvement after that, or your symptoms worsen, I have sent in a prescription for oral Keflex to take.  You do not have to use the antibiotic if your symptoms resolved.  For future outbreaks of herpes, have given you prescription for Valtrex with 1 refill.  Please use at the onset of your symptoms.  If you ever get a surrounding bacterial infection, I have given you a refill for mupirocin to use as prescribed.  We have given you a Tdap vaccine today.  You will need a Td booster every 10 years.   If you have lab work done today you will be contacted with your lab results within the next 2 weeks.  If you have not heard from [place] then please contact us. The fastest way to get your results is to register for My Chart. Impetigo, Adult Impetigo is an infection of the skin. It commonly occurs in young children, but it can also occur in adults. The infection causes itchy blisters and sores that produce brownish-yellow fluid. As the fluid dries, it forms a thick, honey-colored crust. These skin changes usually occur on the face but can also affect other areas of the body. Impetigo usually goes away in 7-10 days with treatment. What are the causes? Impetigo is caused by two types of bacteria. It may be caused by staphylococci or streptococci bacteria. These bacteria cause impetigo when they get under the surface of the skin. This often happens after some damage to the skin, such as damage from:  Cuts, scrapes, or scratches.  Insect bites, especially when you scratch the area of a bite.  Chickenpox or other illnesses that cause open skin sores.  Nail biting or chewing.  Impetigo is contagious and can spread easily from one person to another. This may occur through close skin contact or by sharing towels, clothing, or other items with a person who has the infection. What increases  the risk? Some things that can increase the risk of getting this infection include:  Playing sports that include skin-to-skin contact with others.  Having a skin condition with open sores.  Having many skin cuts or scrapes.  Living in an area that has high humidity levels.  Having poor hygiene.  Having high levels of staphylococci in your nose.  What are the signs or symptoms? Impetigo usually starts out as small blisters, often on the face. The blisters then break open and turn into tiny sores (lesions) with a yellow crust. In some cases, the blisters cause itching or burning. With scratching, irritation, or lack of treatment, these small lesions may get larger. Scratching can also cause impetigo to spread to other parts of the body. The bacteria can get under the fingernails and spread when you touch another area of your skin. Other possible symptoms include:  Larger blisters.  Pus.  Swollen lymph glands.  How is this diagnosed? This condition is usually diagnosed during a physical exam. A skin sample or sample of fluid from a blister may be taken for lab tests that involve growing bacteria (culture test). This can help confirm the diagnosis or help determine the best treatment. How is this treated? Mild impetigo can be treated with prescription antibiotic cream. Oral antibiotic medicine may be used in more severe cases. Medicines for itching may also be used. Follow these instructions at home:  Take medicines only  as directed by your health care provider.  To help prevent impetigo from spreading to other body areas: ? Keep your fingernails short and clean. ? Do not scratch the blisters or sores. ? Cover infected areas, if necessary, to keep from scratching.  Gently wash the infected areas with antibiotic soap and water.  Soak crusted areas in warm, soapy water using antibiotic soap. ? Gently rub the areas to remove crusts. Do not scrub.  Wash your hands often to avoid  spreading this infection.  Stay home until you have used an antibiotic cream for 48 hours (2 days) or an oral antibiotic medicine for 24 hours (1 day). You should only return to work and activities with other people if your skin shows significant improvement. How is this prevented? To keep the infection from spreading:  Stay home until you have used an antibiotic cream for 48 hours or an oral antibiotic for 24 hours.  Wash your hands often.  Do not engage in skin-to-skin contact with other people while you have still have blisters.  Do not share towels, washcloths, or bedding with others while you have the infection.  Contact a health care provider if:  You develop more blisters or sores despite treatment.  Other family members get sores.  Your skin sores are not improving after 48 hours of treatment.  You have a fever. Get help right away if:  You see spreading redness or swelling of the skin around your sores.  You see red streaks coming from your sores.  You develop a sore throat. This information is not intended to replace advice given to you by your health care provider. Make sure you discuss any questions you have with your health care provider. Document Released: 03/20/2014 Document Revised: 08/05/2015 Document Reviewed: 02/10/2014 Elsevier Interactive Patient Education  2017 [employer].   IF you received an x-ray today, you will receive an invoice from [facility]. Please contact [facility] at [phone] with questions or concerns regarding your invoice.   IF you received labwork today, you will receive an invoice from [facility]. Please contact [facility] at [phone] with questions or concerns regarding your invoice.   Our billing staff will not be able to assist you with questions regarding bills from these companies.  You will be contacted with the lab results as soon as they are available. The fastest way to get your results is to activate  your My Chart account. Instructions are located on the last page of this paperwork. If you have not heard from [place] regarding the results in 2 weeks, please contact this office.

## 2017-11-27 ENCOUNTER — Encounter: Payer: Self-pay | Admitting: Physician Assistant

## 2017-11-27 DIAGNOSIS — S20212A Contusion of left front wall of thorax, initial encounter: Secondary | ICD-10-CM | POA: Diagnosis not present

## 2017-11-28 ENCOUNTER — Telehealth: Payer: Self-pay | Admitting: Physician Assistant

## 2017-11-28 NOTE — Telephone Encounter (Signed)
Copied from CRM [id number]. Topic: General - Other >> Nov 28, 2017 12:05 PM Jaquita Rectoravis, Karen A wrote: Reason for [id number]:[id number] [place] - [address] AT [facility] [address] [phone] (Fax)  Called to say that patient was in to pick up Rx of acyclovir ointment but there was no Rx sent in for that medication. Requesting Rx faxed over if Dr Barnett AbuWiseman did tell patient that they would get this treatment. Please advise

## 2017-12-01 ENCOUNTER — Emergency Department (HOSPITAL_COMMUNITY): Payer: BLUE CROSS/BLUE SHIELD

## 2017-12-01 ENCOUNTER — Emergency Department (HOSPITAL_COMMUNITY)
Admission: EM | Admit: 2017-12-01 | Discharge: 2017-12-01 | Disposition: A | Discharge status: Home / Self Care | Payer: BLUE CROSS/BLUE SHIELD | Attending: Emergency Medicine | Admitting: Emergency Medicine

## 2017-12-01 ENCOUNTER — Encounter (HOSPITAL_COMMUNITY): Payer: Self-pay

## 2017-12-01 ENCOUNTER — Other Ambulatory Visit: Payer: Self-pay

## 2017-12-01 DIAGNOSIS — S060X1A Concussion with loss of consciousness of 30 minutes or less, initial encounter: Secondary | ICD-10-CM | POA: Diagnosis not present

## 2017-12-01 DIAGNOSIS — W51XXXA Accidental striking against or bumped into by another person, initial encounter: Secondary | ICD-10-CM | POA: Diagnosis not present

## 2017-12-01 DIAGNOSIS — Y9363 Activity, rugby: Secondary | ICD-10-CM | POA: Diagnosis not present

## 2017-12-01 DIAGNOSIS — S0990XA Unspecified injury of head, initial encounter: Secondary | ICD-10-CM

## 2017-12-01 DIAGNOSIS — S060X0A Concussion without loss of consciousness, initial encounter: Secondary | ICD-10-CM | POA: Diagnosis not present

## 2017-12-01 DIAGNOSIS — Y92328 Other athletic field as the place of occurrence of the external cause: Secondary | ICD-10-CM | POA: Diagnosis not present

## 2017-12-01 DIAGNOSIS — Y998 Other external cause status: Secondary | ICD-10-CM | POA: Insufficient documentation

## 2017-12-01 DIAGNOSIS — Z87891 Personal history of nicotine dependence: Secondary | ICD-10-CM | POA: Diagnosis not present

## 2017-12-01 DIAGNOSIS — Z79899 Other long term (current) drug therapy: Secondary | ICD-10-CM | POA: Diagnosis not present

## 2017-12-01 NOTE — ED Triage Notes (Signed)
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 He reports increased anger, feeling on edge, difficulty gathering his thoughts, and slight nausea. Endorses photophobia as well.
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
## 2017-12-01 NOTE — Discharge Instructions (Addendum)
Your CT scan did not show any signs of bleeding at this time.  Follow-up with your primary doctor.  Return here for any worsening in your condition.

## 2017-12-03 ENCOUNTER — Encounter: Payer: Self-pay | Admitting: Physician Assistant

## 2017-12-05 DIAGNOSIS — S060X9A Concussion with loss of consciousness of unspecified duration, initial encounter: Secondary | ICD-10-CM | POA: Diagnosis not present

## 2017-12-07 ENCOUNTER — Other Ambulatory Visit: Payer: Self-pay | Admitting: Otolaryngology

## 2017-12-11 ENCOUNTER — Ambulatory Visit: Payer: BLUE CROSS/BLUE SHIELD | Admitting: Emergency Medicine

## 2017-12-11 DIAGNOSIS — J343 Hypertrophy of nasal turbinates: Secondary | ICD-10-CM

## 2017-12-11 DIAGNOSIS — J322 Chronic ethmoidal sinusitis: Secondary | ICD-10-CM

## 2017-12-11 DIAGNOSIS — J323 Chronic sphenoidal sinusitis: Secondary | ICD-10-CM

## 2017-12-11 DIAGNOSIS — H6690 Otitis media, unspecified, unspecified ear: Secondary | ICD-10-CM

## 2017-12-11 DIAGNOSIS — J321 Chronic frontal sinusitis: Secondary | ICD-10-CM

## 2017-12-11 DIAGNOSIS — J342 Deviated nasal septum: Secondary | ICD-10-CM

## 2017-12-11 DIAGNOSIS — J320 Chronic maxillary sinusitis: Secondary | ICD-10-CM

## 2017-12-11 HISTORY — DX: Hypertrophy of nasal turbinates: J34.3

## 2017-12-11 HISTORY — DX: Chronic maxillary sinusitis: J32.0

## 2017-12-11 HISTORY — DX: Deviated nasal septum: J34.2

## 2017-12-11 HISTORY — DX: Chronic ethmoidal sinusitis: J32.2

## 2017-12-11 HISTORY — DX: Chronic frontal sinusitis: J32.1

## 2017-12-11 HISTORY — DX: Chronic sphenoidal sinusitis: J32.3

## 2017-12-11 HISTORY — DX: Otitis media, unspecified, unspecified ear: H66.90

## 2017-12-17 ENCOUNTER — Telehealth: Payer: Self-pay | Admitting: Physician Assistant

## 2017-12-17 MED ORDER — ACYCLOVIR 5 % EX CREA: 1.0000 "application " | TOPICAL_CREAM | CUTANEOUS | 1 refills | Status: DC | PRN

## 2017-12-17 NOTE — Telephone Encounter (Signed)
Please call pt and let him know the topical cream is at his pharmacy.

## 2017-12-17 NOTE — Addendum Note (Signed)
Addended by: Benjiman Core D on: 12/17/2017 05:45 AM   Modules accepted: Orders

## 2017-12-17 NOTE — Telephone Encounter (Signed)
Copied from CRM [id number]. Topic: Quick Communication - See Telephone Encounter >> Dec 17, 2017 10:05 AM Trula Slade wrote: CRM for notification. See Telephone encounter for: 12/17/17. Shanda Bumps w/[facility] [phone] received a acyclovir cream (ZOVIRAX) 5 % prescription this morning for the patient, but she would like to know if it can be substituted for an ointment because the patient's insurance will not cover the cream.

## 2017-12-18 DIAGNOSIS — Z79899 Other long term (current) drug therapy: Secondary | ICD-10-CM | POA: Diagnosis not present

## 2017-12-18 DIAGNOSIS — F902 Attention-deficit hyperactivity disorder, combined type: Secondary | ICD-10-CM | POA: Diagnosis not present

## 2017-12-19 NOTE — Telephone Encounter (Signed)
Left message on voicemail to return office call. Dgaddy, CMA 

## 2017-12-20 NOTE — Telephone Encounter (Signed)
Pharmacy advised  

## 2017-12-23 ENCOUNTER — Encounter: Payer: Self-pay | Admitting: Physician Assistant

## 2017-12-24 NOTE — Telephone Encounter (Signed)
Left message to call back  

## 2017-12-25 DIAGNOSIS — R609 Edema, unspecified: Secondary | ICD-10-CM | POA: Diagnosis not present

## 2017-12-31 ENCOUNTER — Other Ambulatory Visit: Payer: Self-pay

## 2017-12-31 ENCOUNTER — Encounter (HOSPITAL_BASED_OUTPATIENT_CLINIC_OR_DEPARTMENT_OTHER): Payer: Self-pay | Admitting: *Deleted

## 2018-01-07 ENCOUNTER — Ambulatory Visit (HOSPITAL_BASED_OUTPATIENT_CLINIC_OR_DEPARTMENT_OTHER): Payer: BLUE CROSS/BLUE SHIELD | Admitting: Anesthesiology

## 2018-01-07 ENCOUNTER — Encounter (HOSPITAL_BASED_OUTPATIENT_CLINIC_OR_DEPARTMENT_OTHER): Payer: Self-pay | Admitting: Anesthesiology

## 2018-01-07 ENCOUNTER — Ambulatory Visit (HOSPITAL_BASED_OUTPATIENT_CLINIC_OR_DEPARTMENT_OTHER)
Admission: RE | Admit: 2018-01-07 | Discharge: 2018-01-07 | Disposition: A | Discharge status: Home / Self Care | Payer: BLUE CROSS/BLUE SHIELD | Source: Ambulatory Visit | Attending: Otolaryngology | Admitting: Otolaryngology

## 2018-01-07 ENCOUNTER — Encounter (HOSPITAL_BASED_OUTPATIENT_CLINIC_OR_DEPARTMENT_OTHER)
Admission: RE | Disposition: A | Discharge status: Home / Self Care | Payer: Self-pay | Source: Ambulatory Visit | Attending: Otolaryngology

## 2018-01-07 DIAGNOSIS — J323 Chronic sphenoidal sinusitis: Secondary | ICD-10-CM | POA: Diagnosis not present

## 2018-01-07 DIAGNOSIS — J321 Chronic frontal sinusitis: Secondary | ICD-10-CM | POA: Diagnosis not present

## 2018-01-07 DIAGNOSIS — J324 Chronic pansinusitis: Secondary | ICD-10-CM | POA: Diagnosis not present

## 2018-01-07 DIAGNOSIS — J343 Hypertrophy of nasal turbinates: Secondary | ICD-10-CM | POA: Insufficient documentation

## 2018-01-07 DIAGNOSIS — J3489 Other specified disorders of nose and nasal sinuses: Secondary | ICD-10-CM | POA: Diagnosis not present

## 2018-01-07 DIAGNOSIS — H6983 Other specified disorders of Eustachian tube, bilateral: Secondary | ICD-10-CM | POA: Diagnosis not present

## 2018-01-07 DIAGNOSIS — H6523 Chronic serous otitis media, bilateral: Secondary | ICD-10-CM | POA: Diagnosis not present

## 2018-01-07 DIAGNOSIS — J339 Nasal polyp, unspecified: Secondary | ICD-10-CM | POA: Insufficient documentation

## 2018-01-07 DIAGNOSIS — H65493 Other chronic nonsuppurative otitis media, bilateral: Secondary | ICD-10-CM | POA: Insufficient documentation

## 2018-01-07 DIAGNOSIS — Z87891 Personal history of nicotine dependence: Secondary | ICD-10-CM | POA: Diagnosis not present

## 2018-01-07 DIAGNOSIS — J342 Deviated nasal septum: Secondary | ICD-10-CM | POA: Insufficient documentation

## 2018-01-07 DIAGNOSIS — J338 Other polyp of sinus: Secondary | ICD-10-CM | POA: Diagnosis not present

## 2018-01-07 DIAGNOSIS — J322 Chronic ethmoidal sinusitis: Secondary | ICD-10-CM | POA: Diagnosis not present

## 2018-01-07 DIAGNOSIS — H900 Conductive hearing loss, bilateral: Secondary | ICD-10-CM | POA: Insufficient documentation

## 2018-01-07 DIAGNOSIS — J320 Chronic maxillary sinusitis: Secondary | ICD-10-CM | POA: Diagnosis not present

## 2018-01-07 HISTORY — DX: Other seasonal allergic rhinitis: J30.2

## 2018-01-07 HISTORY — PX: NASAL SEPTOPLASTY W/ TURBINOPLASTY: SHX2070

## 2018-01-07 HISTORY — DX: Chronic maxillary sinusitis: J32.0

## 2018-01-07 HISTORY — DX: Family history of other specified conditions: Z84.89

## 2018-01-07 HISTORY — DX: Hypertrophy of nasal turbinates: J34.3

## 2018-01-07 HISTORY — DX: Otitis media, unspecified, unspecified ear: H66.90

## 2018-01-07 HISTORY — DX: Chronic ethmoidal sinusitis: J32.2

## 2018-01-07 HISTORY — DX: Developmental disorder of scholastic skills, unspecified: F81.9

## 2018-01-07 HISTORY — PX: SINUS ENDO W/FUSION: SHX777

## 2018-01-07 HISTORY — PX: SINUS ENDO WITH FUSION: SHX5329

## 2018-01-07 HISTORY — PX: MAXILLARY ANTROSTOMY: SHX2003

## 2018-01-07 HISTORY — DX: Deviated nasal septum: J34.2

## 2018-01-07 HISTORY — PX: ETHMOIDECTOMY: SHX5197

## 2018-01-07 HISTORY — DX: Chronic frontal sinusitis: J32.1

## 2018-01-07 HISTORY — DX: Attention-deficit hyperactivity disorder, unspecified type: F90.9

## 2018-01-07 HISTORY — PX: MYRINGOTOMY WITH TUBE PLACEMENT: SHX5663

## 2018-01-07 HISTORY — DX: Dental restoration status: Z98.811

## 2018-01-07 HISTORY — DX: Chronic sphenoidal sinusitis: J32.3

## 2018-01-07 SURGERY — MYRINGOTOMY WITH TUBE PLACEMENT
Anesthesia: General | Site: Nose | Laterality: Left

## 2018-01-07 MED ORDER — EPHEDRINE 5 MG/ML INJ
INTRAVENOUS | Status: AC
  Filled 2018-01-07: qty 10

## 2018-01-07 MED ORDER — LIDOCAINE 2% (20 MG/ML) 5 ML SYRINGE
INTRAMUSCULAR | Status: AC
  Filled 2018-01-07: qty 5

## 2018-01-07 MED ORDER — SUGAMMADEX SODIUM 200 MG/2ML IV SOLN
INTRAVENOUS | Status: DC | PRN
  Administered 2018-01-07: 250 mg via INTRAVENOUS

## 2018-01-07 MED ORDER — SCOPOLAMINE 1 MG/3DAYS TD PT72: 1.0000 | MEDICATED_PATCH | Freq: Once | TRANSDERMAL | Status: DC | PRN

## 2018-01-07 MED ORDER — DEXAMETHASONE SODIUM PHOSPHATE 4 MG/ML IJ SOLN
INTRAMUSCULAR | Status: DC | PRN
  Administered 2018-01-07: 10 mg via INTRAVENOUS

## 2018-01-07 MED ORDER — SUFENTANIL CITRATE 50 MCG/ML IV SOLN
INTRAVENOUS | Status: AC
  Filled 2018-01-07: qty 1

## 2018-01-07 MED ORDER — SUFENTANIL CITRATE 50 MCG/ML IV SOLN
INTRAVENOUS | Status: DC | PRN
  Administered 2018-01-07 (×2): 10 ug via INTRAVENOUS

## 2018-01-07 MED ORDER — MIDAZOLAM HCL 2 MG/2ML IJ SOLN
INTRAMUSCULAR | Status: AC
  Filled 2018-01-07: qty 2

## 2018-01-07 MED ORDER — LACTATED RINGERS IV SOLN
INTRAVENOUS | Status: DC
  Administered 2018-01-07 (×2): via INTRAVENOUS

## 2018-01-07 MED ORDER — MIDAZOLAM HCL 2 MG/2ML IJ SOLN
1.0000 mg | INTRAMUSCULAR | Status: DC | PRN
  Administered 2018-01-07: 2 mg via INTRAVENOUS

## 2018-01-07 MED ORDER — HYDROMORPHONE HCL 1 MG/ML IJ SOLN
INTRAMUSCULAR | Status: AC
  Filled 2018-01-07: qty 0.5

## 2018-01-07 MED ORDER — OXYCODONE-ACETAMINOPHEN 5-325 MG PO TABS: 1.0000 | ORAL_TABLET | ORAL | 0 refills | Status: AC | PRN

## 2018-01-07 MED ORDER — DEXMEDETOMIDINE HCL IN NACL 200 MCG/50ML IV SOLN
INTRAVENOUS | Status: AC
  Filled 2018-01-07: qty 50

## 2018-01-07 MED ORDER — CIPROFLOXACIN-FLUOCINOLONE PF 0.3-0.025 % OT SOLN
OTIC | Status: DC | PRN
  Administered 2018-01-07: .5 mL via OTIC

## 2018-01-07 MED ORDER — OXYMETAZOLINE HCL 0.05 % NA SOLN
NASAL | Status: DC | PRN
  Administered 2018-01-07: 1 via TOPICAL

## 2018-01-07 MED ORDER — COCAINE HCL 4 % EX SOLN
CUTANEOUS | Status: DC | PRN
  Administered 2018-01-07: 4 mL via TOPICAL

## 2018-01-07 MED ORDER — HYDROMORPHONE HCL 1 MG/ML IJ SOLN
0.2500 mg | INTRAMUSCULAR | Status: DC | PRN
  Administered 2018-01-07: 0.5 mg via INTRAVENOUS

## 2018-01-07 MED ORDER — LIDOCAINE-EPINEPHRINE 1 %-1:100000 IJ SOLN
INTRAMUSCULAR | Status: DC | PRN
  Administered 2018-01-07: 2 mL

## 2018-01-07 MED ORDER — DEXMEDETOMIDINE HCL IN NACL 200 MCG/50ML IV SOLN
INTRAVENOUS | Status: DC | PRN
  Administered 2018-01-07: 25 ug via INTRAVENOUS

## 2018-01-07 MED ORDER — PHENYLEPHRINE 40 MCG/ML (10ML) SYRINGE FOR IV PUSH (FOR BLOOD PRESSURE SUPPORT)
PREFILLED_SYRINGE | INTRAVENOUS | Status: AC
  Filled 2018-01-07: qty 20

## 2018-01-07 MED ORDER — OXYCODONE HCL 5 MG/5ML PO SOLN: 5.0000 mg | Freq: Once | ORAL | Status: DC | PRN

## 2018-01-07 MED ORDER — DEXAMETHASONE SODIUM PHOSPHATE 10 MG/ML IJ SOLN
INTRAMUSCULAR | Status: AC
  Filled 2018-01-07: qty 2

## 2018-01-07 MED ORDER — ONDANSETRON HCL 4 MG/2ML IJ SOLN
INTRAMUSCULAR | Status: AC
  Filled 2018-01-07: qty 4

## 2018-01-07 MED ORDER — PROPOFOL 10 MG/ML IV BOLUS
INTRAVENOUS | Status: DC | PRN
  Administered 2018-01-07: 200 mg via INTRAVENOUS

## 2018-01-07 MED ORDER — SUCCINYLCHOLINE CHLORIDE 200 MG/10ML IV SOSY
PREFILLED_SYRINGE | INTRAVENOUS | Status: AC
  Filled 2018-01-07: qty 10

## 2018-01-07 MED ORDER — EPHEDRINE SULFATE 50 MG/ML IJ SOLN
INTRAMUSCULAR | Status: DC | PRN
  Administered 2018-01-07: 15 mg via INTRAVENOUS

## 2018-01-07 MED ORDER — COCAINE HCL 4 % EX SOLN
CUTANEOUS | Status: AC
  Filled 2018-01-07: qty 4

## 2018-01-07 MED ORDER — CEFAZOLIN SODIUM-DEXTROSE 2-4 GM/100ML-% IV SOLN
INTRAVENOUS | Status: AC
  Filled 2018-01-07: qty 100

## 2018-01-07 MED ORDER — ONDANSETRON HCL 4 MG/2ML IJ SOLN
INTRAMUSCULAR | Status: DC | PRN
  Administered 2018-01-07: 4 mg via INTRAVENOUS

## 2018-01-07 MED ORDER — OXYCODONE HCL 5 MG PO TABS: 5.0000 mg | ORAL_TABLET | Freq: Once | ORAL | Status: DC | PRN

## 2018-01-07 MED ORDER — MUPIROCIN 2 % EX OINT
TOPICAL_OINTMENT | CUTANEOUS | Status: DC | PRN
  Administered 2018-01-07: 1 via TOPICAL

## 2018-01-07 MED ORDER — ROCURONIUM BROMIDE 50 MG/5ML IV SOSY
PREFILLED_SYRINGE | INTRAVENOUS | Status: AC
  Filled 2018-01-07: qty 5

## 2018-01-07 MED ORDER — ROCURONIUM BROMIDE 100 MG/10ML IV SOLN
INTRAVENOUS | Status: DC | PRN
  Administered 2018-01-07: 50 mg via INTRAVENOUS

## 2018-01-07 MED ORDER — FENTANYL CITRATE (PF) 100 MCG/2ML IJ SOLN: 50.0000 ug | INTRAMUSCULAR | Status: DC | PRN

## 2018-01-07 MED ORDER — AMOXICILLIN 875 MG PO TABS: 875.0000 mg | ORAL_TABLET | Freq: Two times a day (BID) | ORAL | 0 refills | Status: AC

## 2018-01-07 MED ORDER — PROMETHAZINE HCL 25 MG/ML IJ SOLN: 6.2500 mg | INTRAMUSCULAR | Status: DC | PRN

## 2018-01-07 SURGICAL SUPPLY — 57 items
BLADE MYRINGOTOMY 45DEG STRL (BLADE) ×4 IMPLANT
BLADE RAD40 ROTATE 4M 4 5PK (BLADE) IMPLANT
BLADE RAD60 ROTATE M4 4 5PK (BLADE) IMPLANT
BLADE ROTATE RAD 12 4 M4 (BLADE) IMPLANT
BLADE ROTATE RAD 40 4 M4 (BLADE) IMPLANT
BLADE ROTATE TRICUT 4X13 M4 (BLADE) ×4 IMPLANT
BLADE TRICUT ROTATE M4 4 5PK (BLADE) IMPLANT
BUR HS RAD FRONTAL 3 (BURR) IMPLANT
CANISTER SUC SOCK COL 7IN (MISCELLANEOUS) ×8 IMPLANT
CANISTER SUCT 1200ML W/VALVE (MISCELLANEOUS) ×4 IMPLANT
COAGULATOR SUCT 8FR VV (MISCELLANEOUS) ×4 IMPLANT
COAGULATOR SUCT SWTCH 10FR 6 (ELECTROSURGICAL) IMPLANT
COTTONBALL LRG STERILE PKG (GAUZE/BANDAGES/DRESSINGS) ×4 IMPLANT
COVER WAND RF STERILE (DRAPES) IMPLANT
DECANTER SPIKE VIAL GLASS SM (MISCELLANEOUS) IMPLANT
DRSG NASAL KENNEDY LMNT 8CM (GAUZE/BANDAGES/DRESSINGS) IMPLANT
DRSG NASOPORE 8CM (GAUZE/BANDAGES/DRESSINGS) ×4 IMPLANT
DRSG TELFA 3X8 NADH (GAUZE/BANDAGES/DRESSINGS) IMPLANT
ELECT REM PT RETURN 9FT ADLT (ELECTROSURGICAL) ×4
ELECTRODE REM PT RTRN 9FT ADLT (ELECTROSURGICAL) ×3 IMPLANT
GAUZE SPONGE 4X4 12PLY STRL LF (GAUZE/BANDAGES/DRESSINGS) IMPLANT
GLOVE BIO SURGEON STRL SZ7 (GLOVE) ×4 IMPLANT
GLOVE BIO SURGEON STRL SZ7.5 (GLOVE) ×4 IMPLANT
GOWN STRL REUS W/ TWL LRG LVL3 (GOWN DISPOSABLE) ×3 IMPLANT
GOWN STRL REUS W/TWL LRG LVL3 (GOWN DISPOSABLE) ×1
HEMOSTAT SURGICEL 2X14 (HEMOSTASIS) IMPLANT
IV NS 1000ML (IV SOLUTION)
IV NS 1000ML BAXH (IV SOLUTION) IMPLANT
IV NS 500ML (IV SOLUTION) ×1
IV NS 500ML BAXH (IV SOLUTION) ×3 IMPLANT
IV SET EXT 30 76VOL 4 MALE LL (IV SETS) ×4 IMPLANT
NEEDLE HYPO 25X1 1.5 SAFETY (NEEDLE) IMPLANT
NEEDLE PRECISIONGLIDE 27X1.5 (NEEDLE) ×4 IMPLANT
NEEDLE SPNL 25GX3.5 QUINCKE BL (NEEDLE) IMPLANT
NS IRRIG 1000ML POUR BTL (IV SOLUTION) ×4 IMPLANT
PACK BASIN DAY SURGERY FS (CUSTOM PROCEDURE TRAY) ×4 IMPLANT
PACK ENT DAY SURGERY (CUSTOM PROCEDURE TRAY) ×4 IMPLANT
PACKING NASAL EPIS 4X2.4 XEROG (MISCELLANEOUS) IMPLANT
SLEEVE SCD COMPRESS KNEE MED (MISCELLANEOUS) ×4 IMPLANT
SOLUTION BUTLER CLEAR DIP (MISCELLANEOUS) ×4 IMPLANT
SPLINT NASAL AIRWAY SILICONE (MISCELLANEOUS) ×4 IMPLANT
SPONGE GAUZE 2X2 8PLY STRL LF (GAUZE/BANDAGES/DRESSINGS) IMPLANT
SPONGE NEURO XRAY DETECT 1X3 (DISPOSABLE) ×4 IMPLANT
SUT CHROMIC 4 0 P 3 18 (SUTURE) ×4 IMPLANT
SUT PLAIN 4 0 ~~LOC~~ 1 (SUTURE) ×4 IMPLANT
SUT PROLENE 3 0 PS 2 (SUTURE) ×4 IMPLANT
SUT VIC AB 4-0 P-3 18XBRD (SUTURE) IMPLANT
SUT VIC AB 4-0 P3 18 (SUTURE)
TOWEL GREEN STERILE FF (TOWEL DISPOSABLE) ×8 IMPLANT
TRACKER ENT INSTRUMENT (MISCELLANEOUS) ×4 IMPLANT
TRACKER ENT PATIENT (MISCELLANEOUS) ×4 IMPLANT
TUBE CONNECTING 20X1/4 (TUBING) ×8 IMPLANT
TUBE EAR SHEEHY BUTTON 1.27 (OTOLOGIC RELATED) IMPLANT
TUBE EAR T MOD 1.32X4.8 BL (OTOLOGIC RELATED) ×8 IMPLANT
TUBE SALEM SUMP 16 FR W/ARV (TUBING) ×4 IMPLANT
TUBING STRAIGHTSHOT EPS 5PK (TUBING) ×4 IMPLANT
YANKAUER SUCT BULB TIP NO VENT (SUCTIONS) ×4 IMPLANT

## 2018-01-07 NOTE — H&P (Signed)
Cc: Chronic rhinosinusitis, chronic middle ear effusion, severe nasal obstruction  HPI: The patient is a 22 year old male who returns today for his follow-up evaluation.  The patient has a history of chronic eustachian tube dysfunction, chronic middle ear effusion, and severe nasal obstruction.  At his last visit, he was noted to have nasal septal deviation, bilateral inferior turbinate hypertrophy, and right nasal polyps.  The patient was previously treated with Flonase nasal spray and allergy medications without improvement in his symptoms. He underwent bilateral myringotomy and tube placement in 2017.  The tubes have since extruded.  The patient recently underwent a sinus CT scan.  The CT showed mucosal thickening in the left frontal sinus, extending into the frontal recess.  The right frontal sinus is clear.  He also has mucosal thickening in both ethmoid sinuses. Both maxillary antrums are also obstructed by polypoid tissue.  His sphenoid ostia are obstructed bilaterally, with opacification of bilateral sphenoid ethmoidal recesses.  The patient returns today reporting no significant change in his symptoms.  He continues to have difficulty with his hearing.  He also has significant difficulty breathing through his nostrils.  He is interested in more definitive treatment of his conditions. No other ENT, GI, or respiratory issue noted since the last visit.   Exam: General: Communicates without difficulty, well nourished, no acute distress. Head: Normocephalic, no evidence injury, no tenderness, facial buttresses intact without stepoff. Eyes: PERRL, EOMI. No scleral icterus, conjunctivae clear. Neuro: CN II exam reveals vision grossly intact.  No nystagmus at any point of gaze. Ears: Auricles well formed without lesions.  Ear canals are intact without mass or lesion.  No erythema or edema is appreciated.  The TMs are retracted, with fluid bilaterally. Nose: External evaluation reveals normal support and skin  without lesions.  Dorsum is intact.  Anterior rhinoscopy reveals congested and edematous mucosa over anterior aspect of the inferior turbinates and deviated nasal septum. Middle meatus is not well visualized. Oral:  Oral cavity and oropharynx are intact, symmetric, without erythema or edema.  Mucosa is moist without lesions. Neck: Full range of motion without pain.  There is no significant lymphadenopathy.  No masses palpable.  Thyroid bed within normal limits to palpation.  Parotid glands and submandibular glands equal bilaterally without mass.  Trachea is midline. Neuro:  CN 2-12 grossly intact. Gait normal. Vestibular: No nystagmus at any point of gaze.   Procedure:  Flexible Nasal Endoscopy: Risks, benefits, and alternatives of flexible endoscopy were explained to the patient.  Specific mention was made of the risk of throat numbness with difficulty swallowing, possible bleeding from the nose and mouth, and pain from the procedure.  The patient gave oral consent to proceed.  The nasal cavities were decongested and anesthetised with a combination of oxymetazoline and 4% lidocaine solution.  The flexible scope was inserted into the right nasal cavity.  Endoscopy of the inferior and middle meatus was performed.  The edematous mucosa was as described above.  No mass or lesion was appreciated. Polyp noted at middle meatus.  Olfactory cleft was clear.  Nasopharynx was clear.  Turbinates were hypertrophied but without mass.  Incomplete response to decongestion.  The procedure was repeated on the contralateral side with NSD and spur.  The patient tolerated the procedure well.  Instructions were given to avoid eating or drinking for 2 hours.   Assessment  1.  Chronic eustachian tube dysfunction with middle ear effusion and conductive hearing loss.  His tympanic membranes are retracted bilaterally.  2.  The patient also has chronic nasal obstruction, with severe nasal septal deviation and bilateral inferior  turbinate hypertrophy.  In addition, he also has significant mucosal polyps, obstructing his left frontal sinus, bilateral ethmoid sinuses, and bilateral maxillary and sphenoid ostia.  The patient has not responded to allergy and steroid treatments.    Plan: 1.  The physical exam and nasal endoscopy findings are reviewed with the patient.  2.  The CT images are also reviewed.  3.  Based on the above findings, the patient may benefit from surgical intervention with bilateral revision myringotomy and tube placement, septoplasty, turbinate reduction, and bilateral endoscopic sinus surgeries, involving the left frontal, bilateral ethmoid and sphenoid, and bilateral maxillary sinuses.  The risks, benefits, and details of the procedures are extensively reviewed with the patient.  4.  The patient would like to proceed with the procedures.

## 2018-01-07 NOTE — Anesthesia Procedure Notes (Signed)
Procedure Name: Intubation Date/Time: 01/07/2018 8:51 AM Performed by: Signe Colt, CRNA Pre-anesthesia Checklist: Patient identified, Emergency Drugs available, Suction available and Patient being monitored Patient Re-evaluated:Patient Re-evaluated prior to induction Oxygen Delivery Method: Circle system utilized Preoxygenation: Pre-oxygenation with 100% oxygen Induction Type: IV induction Ventilation: Mask ventilation without difficulty Laryngoscope Size: Mac and 3 Grade View: Grade I Tube type: Oral Tube size: 7.0 mm Number of attempts: 1 Airway Equipment and Method: Stylet and Oral airway Placement Confirmation: ETT inserted through vocal cords under direct vision,  positive ETCO2 and breath sounds checked- equal and bilateral Secured at: 21 cm Tube secured with: Tape Dental Injury: Teeth and Oropharynx as per pre-operative assessment

## 2018-01-07 NOTE — Transfer of Care (Signed)
Immediate Anesthesia Transfer of Care Note  Patient: Nicholas [name]  Procedure(s) Performed: MYRINGOTOMY WITH T-TUBE PLACEMENT (Bilateral Ear) NASAL SEPTOPLASTY WITH TURBINATE REDUCTION (Bilateral Nose) BILATERAL ENDOSCOPIC MAXILLARY ANTROSTOMY (Bilateral Nose) RECESS LEFT ENDOSCOPIC FRONTAL SINUS EXPLORATION (Left Nose) BILATERAL ETHMOIDECTOMY AND SPHEMOIDECTOMY WITH TISSUE REMOVAL (Bilateral Nose) ENDOSCOPIC SINUS SURGERY WITH FUSION NAVIGATION (Bilateral Nose)  Patient Location: PACU  Anesthesia Type:General  Level of Consciousness: alert , oriented and drowsy  Airway & Oxygen Therapy: Patient Spontanous Breathing and Patient connected to face mask oxygen  Post-op Assessment: Report given to RN and Post -op Vital signs reviewed and stable  Post vital signs: Reviewed and stable  Last Vitals:  Vitals Value Taken Time  BP    Temp    Pulse 103 01/07/2018 11:17 AM  Resp 9 01/07/2018 11:17 AM  SpO2 99 % 01/07/2018 11:17 AM  Vitals shown include unvalidated device data.  Last Pain:  Vitals:   01/07/18 0825  TempSrc: Oral  PainSc: 0-No pain      Patients Stated Pain Goal: 2 (01/07/18 0825)  Complications: No apparent anesthesia complications

## 2018-01-07 NOTE — Anesthesia Preprocedure Evaluation (Addendum)
Anesthesia Evaluation  Patient identified by MRN, date of birth, ID band Patient awake    Reviewed: Allergy & Precautions, NPO status , Patient's Chart, lab work & pertinent test results  Airway Mallampati: II  TM Distance: >3 FB Neck ROM: Full    Dental no notable dental hx.    Pulmonary former smoker,    Pulmonary exam normal breath sounds clear to auscultation       Cardiovascular negative cardio ROS Normal cardiovascular exam Rhythm:Regular Rate:Normal     Neuro/Psych  Headaches, PSYCHIATRIC DISORDERS Anxiety ADHD (attention deficit hyperactivity disorder)   GI/Hepatic negative GI ROS, Neg liver ROS,   Endo/Other  negative endocrine ROS  Renal/GU negative Renal ROS     Musculoskeletal negative musculoskeletal ROS (+)   Abdominal (+) + obese,   Peds  Hematology negative hematology ROS (+)   Anesthesia Other Findings CHRONIC OTITIS MEDIA,DEVIATED SEPTUM,TURBINATE HYPERTROPHY, MAXILLARY SINUSITIS,FRONTAL SINUSITIS,ETHMOID SINUSITIS, SPHENOID SINUSITITS  Reproductive/Obstetrics                            Anesthesia Physical Anesthesia Plan  ASA: II  Anesthesia Plan: General   Post-op Pain Management:    Induction: Intravenous  PONV Risk Score and Plan: 2 and Midazolam, Dexamethasone, Ondansetron and Treatment may vary due to age or medical condition  Airway Management Planned: Oral ETT  Additional Equipment:   Intra-op Plan:   Post-operative Plan: Extubation in OR  Informed Consent: I have reviewed the patients History and Physical, chart, labs and discussed the procedure including the risks, benefits and alternatives for the proposed anesthesia with the patient or authorized representative who has indicated his/her understanding and acceptance.   Dental advisory given  Plan Discussed with: CRNA  Anesthesia Plan Comments:         Anesthesia Quick Evaluation

## 2018-01-07 NOTE — Anesthesia Procedure Notes (Signed)

## 2018-01-07 NOTE — Op Note (Signed)
DATE OF PROCEDURE: 01/07/2018  OPERATIVE REPORT   SURGEON: Newman Pies, MD   PREOPERATIVE DIAGNOSES:  1. Bilateral chronic pansinusitis and polyposis. 2. Severe nasal septal deviation.  3. Bilateral inferior turbinate hypertrophy.  4. Chronic nasal obstruction. 5. Bilateral eustachian tube dysfunctions. 6. Bilateral middle ear effusion. 7. Bilateral conductive hearing loss.  POSTOPERATIVE DIAGNOSES:  1. Bilateral chronic pansinusitis and polyposis. 2. Severe nasal septal deviation.  3. Bilateral inferior turbinate hypertrophy.  4. Chronic nasal obstruction. 5. Bilateral eustachian tube dysfunctions. 6. Bilateral middle ear effusion. 7. Bilateral conductive hearing loss.  PROCEDURE PERFORMED:  1. Bilateral endoscopic total ethmoidectomy and sphenoidotomy with polyp removal. 2. Left endoscopic frontal sinusotomy. 3. Bilateral endoscopic maxillary antrostomy. 4. Septoplasty.  5. Bilateral partial inferior turbinate resection.  6. Bilateral myringotomy with T tube placement. 7. FUSION stereotactic image guidance.  ANESTHESIA: General endotracheal tube anesthesia.   COMPLICATIONS: None.   ESTIMATED BLOOD LOSS: 150 mL.   INDICATION FOR PROCEDURE: Nicholas [name] is a 22 y.o. male with a history of chronic rhinosinusitis, chronic eustachian tube dysfunction, middle ear effusion, and severe nasal obstruction.  At his last visit, he was noted to have nasal septal deviation, bilateral infeherior turbinate hypertrophy, and nasal polyps.  The patient was previously treated with antibiotics, Flonase nasal spray and allergy medications without improvement in his symptoms. He underwent bilateral myringotomy and tube placement in 2017.  The tubes have since extruded. He was noted to have recurrent middle ear effusion and conductive hearing loss. The patient recently underwent a sinus CT scan.  The CT showed mucosal thickening in the left frontal sinus, extending into the frontal recess.  The right  frontal sinus was clear.  He also has mucosal thickening in both ethmoid sinuses. Both maxillary antrums were also obstructed by polypoid tissue.  His sphenoid ostia were obstructed bilaterally, with opacification of bilateral sphenoid ethmoidal recesses. Based on the above findings, the decision was made for the patient to undergo the above-stated procedures. The risks, benefits, alternatives, and details of the procedures were discussed with the patient. Questions were invited and answered. Informed consent was obtained.   DESCRIPTION OF PROCEDURE: The patient was taken to the operating room and placed supine on the operating table. General endotracheal tube anesthesia was administered by the anesthesiologist. The patient was positioned and prepped and draped in a standard fashion for ear surgery. Under the operating microscope, the right ear canal was cleaned of all cerumen. The right tympanic membrane was noted to be intact but severely retracted. A standard myringotomy incision was made at the anterior-inferior quadrant on the tympanic membrane. A copious amount of serous fluid was suctioned. A T tube was placed, followed by antibiotic eardrops. The same procedure was repeated on the left side without exception.  The patient was then repositioned and prepped and draped in a standard fashion for sinonasal surgery. Pledgets soaked with Afrin were placed in both nasal cavities for decongestion. The pledgets were subsequently removed. The FUSION stereotactic image guidance marker was placed. The stereotactic image guidance system was functional throughout the case. Attention was first focused on the septoplasty portion of the case. The above mentioned severe septal deviation was again noted. 1% lidocaine with 1:100,000 epinephrine was injected onto the nasal septum bilaterally. A hemitransfixion incision was made on the left side. The mucosal flap was carefully elevated on the left side. A cartilaginous  incision was made 1 cm superior to the caudal margin of the nasal septum. Mucosal flap was also elevated on the right side  in the similar fashion. It should be noted that due to the severe septal deviation, the deviated portion of the cartilaginous and bony septum had to be removed in piecemeal fashion. Once the deviated portions were removed, a straight midline septum was achieved. The septum was then quilted with 4-0 plain gut sutures. The hemitransfixion incision was closed with interrupted 4-0 chromic sutures.   The inferior one half of both hypertrophied inferior turbinate was crossclamped with a Kelly clamp. The inferior one half of each inferior turbinate was then resected with a pair of cross cutting scissors. Hemostasis was achieved with a suction cautery device.   Attention was then focused on the paranasal sinuses. Using a 0 endoscope, the left nasal cavity was examined. The left middle turbinate was medialized using a freer elevator. The uncinate process was resected. Polypoid tissue was removed from the middle meatus. The maxillary antrum was entered and enlarged. The maxillary cavity was copiously irrigated. The bony partitions of the left anterior and posterior ethmoid cavities were removed using a combination of Tru-Cut forceps and microdebrider. Polypoid tissue was removed from the ethmoid cavities. The entrance to the left sphenoid sinus was then identified. The opening was enlarged using the microdebrider and Kerrison Rongeur. Polypoid tissue was removed from the sphenoid sinus.  Attention was then focused on the left frontal sinus. The frontal recess was entered. The anterior bony partitions of the frontal recess was taken down. The frontal sinus was copiously irrigated.  The same procedure was repeated on the right site, except the right frontal sinus. No disease was noted within the right frontal recess. Similar findings were noted on the right side. Hemostasis was achieved with nasopore  packing. Doyle splints were applied to the nasal septum bilaterally.  The care of the patient was turned over to the anesthesiologist. The patient was awakened from anesthesia without difficulty. The patient was extubated and transferred to the recovery room in good condition.   OPERATIVE FINDINGS:  1. Severe nasal septal deviation and bilateral inferior turbinate hypertrophy.  2. Bilateral chronic pansinusitis and polyposis. 3. Bilateral eustachian tube dysfunction and middle ear effusion.  SPECIMEN: Bilateral sinus contents.  FOLLOWUP CARE: The patient be discharged home once he is awake and alert. The patient will be placed on Percocet 1 tablets p.o. q.4 hours p.r.n. pain, and amoxicillin 875 mg p.o. b.i.d. for 5 days. The patient will follow up in my office in 2 days for splint removal.   Martrice Apt Philomena Doheny, MD

## 2018-01-07 NOTE — Discharge Instructions (Addendum)
Post Anesthesia Home Care Instructions  Activity: Get plenty of rest for the remainder of the day. A responsible individual must stay with you for 24 hours following the procedure.  For the next 24 hours, DO NOT: -Drive a car -Advertising copywriter -Drink alcoholic beverages -Take any medication unless instructed by your physician -Make any legal decisions or sign important papers.  Meals: Start with liquid foods such as gelatin or soup. Progress to regular foods as tolerated. Avoid greasy, spicy, heavy foods. If nausea and/or vomiting occur, drink only clear liquids until the nausea and/or vomiting subsides. Call your physician if vomiting continues.  Special Instructions/Symptoms: Your throat may feel dry or sore from the anesthesia or the breathing tube placed in your throat during surgery. If this causes discomfort, gargle with warm salt water. The discomfort should disappear within 24 hours.  If you had a scopolamine patch placed behind your ear for the management of post- operative nausea and/or vomiting:  1. The medication in the patch is effective for 72 hours, after which it should be removed.  Wrap patch in a tissue and discard in the trash. Wash hands thoroughly with soap and water. 2. You may remove the patch earlier than 72 hours if you experience unpleasant side effects which may include dry mouth, dizziness or visual disturbances. 3. Avoid touching the patch. Wash your hands with soap and water after contact with the patch.   -------------------  POSTOPERATIVE INSTRUCTIONS FOR PATIENTS HAVING NASAL OR SINUS OPERATIONS ACTIVITY: Restrict activity at home for the first two days, resting as much as possible. Light activity is best. You may usually return to work within a week. You should refrain from nose blowing, strenuous activity, or heavy lifting greater than 20lbs for a total of three weeks after your operation.  If sneezing cannot be avoided, sneeze with your mouth  open. DISCOMFORT: You may experience a dull headache and pressure along with nasal congestion and discharge. These symptoms may be worse during the first week after the operation but may last as long as two to four weeks.  Please take Tylenol or the pain medication that has been prescribed for you. Do not take aspirin or aspirin containing medications since they may cause bleeding.  You may experience symptoms of post nasal drainage, nasal congestion, headaches and fatigue for two or three months after your operation.  BLEEDING: You may have some blood tinged nasal drainage for approximately two weeks after the operation.  The discharge will be worse for the first week.  Please call our office at [phone] or go to the nearest hospital emergency room if you experience any of the following: heavy, bright red blood from your nose or mouth that lasts longer than ten minutes or coughing up or vomiting bright red blood or blood clots. GENERAL CONSIDERATIONS: 1. A gauze dressing will be placed on your upper lip to absorb any drainage after the operation. You may need to change this several times a day.  If you do not have very much drainage, you may remove the dressing.  Remember that you may gently wipe your nose with a tissue and sniff in, but DO NOT blow your nose. 2. Please keep all of your postoperative appointments.  Your final results after the operation will depend on proper follow-up.  The initial visit is usually four to seven days after the operation.  During this visit, the remaining nasal packing and internal septal splints will be removed.  Your nasal and sinus cavities will be  cleaned.  During the second visit, your nasal and sinus cavities will be cleaned again. Have someone drive you to your first two postoperative appointments. We suggest that you take your prescribed pain medication about  hour prior to each of these two appointments.  3. How you care for your nose after the operation will  influence the results that you obtain.  You should follow all directions, take your medication as prescribed, and call our office [phone] with any problems or questions. 4. You may be more comfortable sleeping with your head elevated on two pillows. 5. Do not take any medications that we have not prescribed or recommended. WARNING SIGNS: if any of the following should occur, please call our office: 1. Bright red bleeding which lasts more than 10 minutes. 2. Persistent fever greater than 102F. 3. Persistent vomiting. 4. Severe and constant pain that is not relieved by prescribed pain medication. 5. Trauma to the nose. 6. Rash or unusual side effects from any medicines.   ----------------  POSTOPERATIVE INSTRUCTIONS FOR PATIENTS HAVING MYRINGOTOMY AND TUBES  1. Nausea and vomiting may be expected the first 6 hours after surgery. Offer liquids initially. If there is no nausea, small light meals are usually best tolerated the day of surgery. A normal diet may be resumed once nausea has passed. 2. The patient may experience mild ear discomfort the day of surgery, which is usually relieved by Tylenol. 3. A small amount of clear or blood-tinged drainage from the ears may occur a few days after surgery. If this should persists or become thick, green, yellow, or foul smelling, please contact our office at [phone]. 4. If you see clear, green, or yellow drainage from your childs ear during colds, clean the outer ear gently with a soft, damp washcloth. Begin the prescribed ear drops (4 drops, twice a day) for one week, as previously instructed.  The drainage should stop within 48 hours after starting the ear drops. If the drainage continues or becomes yellow or green, please call our office. If your child develops a fever greater than 102 F, or has and persistent bleeding from the ear(s), please call us. 5. Try to avoid getting water in the ears. Swimming is permitted as long as there is no  deep diving or swimming under water deeper than 3 feet. If you think water has gotten into the ear(s), either bathing or swimming, place 4 drops of the prescribed ear drops into the ear in question. We do recommend drops after swimming in the ocean, rivers, or lakes. 6. It is important for you to return for your scheduled appointment so that the status of the tubes can be determined.   Post Anesthesia Home Care Instructions  Activity: Get plenty of rest for the remainder of the day. A responsible individual must stay with you for 24 hours following the procedure.  For the next 24 hours, DO NOT: -Drive a car -Advertising copywriter -Drink alcoholic beverages -Take any medication unless instructed by your physician -Make any legal decisions or sign important papers.  Meals: Start with liquid foods such as gelatin or soup. Progress to regular foods as tolerated. Avoid greasy, spicy, heavy foods. If nausea and/or vomiting occur, drink only clear liquids until the nausea and/or vomiting subsides. Call your physician if vomiting continues.  Special Instructions/Symptoms: Your throat may feel dry or sore from the anesthesia or the breathing tube placed in your throat during surgery. If this causes discomfort, gargle with warm salt water. The discomfort should  disappear within 24 hours.  If you had a scopolamine patch placed behind your ear for the management of post- operative nausea and/or vomiting:  1. The medication in the patch is effective for 72 hours, after which it should be removed.  Wrap patch in a tissue and discard in the trash. Wash hands thoroughly with soap and water. 2. You may remove the patch earlier than 72 hours if you experience unpleasant side effects which may include dry mouth, dizziness or visual disturbances. 3. Avoid touching the patch. Wash your hands with soap and water after contact with the patch.

## 2018-01-07 NOTE — Anesthesia Postprocedure Evaluation (Signed)
Anesthesia Post Note  Patient: Nicholas [name]  Procedure(s) Performed: MYRINGOTOMY WITH T-TUBE PLACEMENT (Bilateral Ear) NASAL SEPTOPLASTY WITH TURBINATE REDUCTION (Bilateral Nose) BILATERAL ENDOSCOPIC MAXILLARY ANTROSTOMY (Bilateral Nose) RECESS LEFT ENDOSCOPIC FRONTAL SINUS EXPLORATION (Left Nose) BILATERAL ETHMOIDECTOMY AND SPHEMOIDECTOMY WITH TISSUE REMOVAL (Bilateral Nose) ENDOSCOPIC SINUS SURGERY WITH FUSION NAVIGATION (Bilateral Nose)     Patient location during evaluation: PACU Anesthesia Type: General Level of consciousness: awake and alert Pain management: pain level controlled Vital Signs Assessment: post-procedure vital signs reviewed and stable Respiratory status: spontaneous breathing, nonlabored ventilation and respiratory function stable Cardiovascular status: blood pressure returned to baseline and stable Postop Assessment: no apparent nausea or vomiting Anesthetic complications: no    Last Vitals:  Vitals:   01/07/18 1200 01/07/18 1229  BP: (!) 150/94 139/82  Pulse:  86  Resp:  16  Temp:  36.9 C  SpO2:  98%    Last Pain:  Vitals:   01/07/18 1229  TempSrc: Oral  PainSc: 1                  Lucretia Kern

## 2018-01-08 ENCOUNTER — Encounter (HOSPITAL_BASED_OUTPATIENT_CLINIC_OR_DEPARTMENT_OTHER): Payer: Self-pay | Admitting: Otolaryngology

## 2018-01-09 DIAGNOSIS — J321 Chronic frontal sinusitis: Secondary | ICD-10-CM | POA: Diagnosis not present

## 2018-01-09 DIAGNOSIS — J322 Chronic ethmoidal sinusitis: Secondary | ICD-10-CM | POA: Diagnosis not present

## 2018-01-09 DIAGNOSIS — J320 Chronic maxillary sinusitis: Secondary | ICD-10-CM | POA: Diagnosis not present

## 2018-01-09 DIAGNOSIS — J323 Chronic sphenoidal sinusitis: Secondary | ICD-10-CM | POA: Diagnosis not present

## 2018-01-10 ENCOUNTER — Telehealth: Payer: Self-pay | Admitting: *Deleted

## 2018-01-10 ENCOUNTER — Ambulatory Visit: Payer: BLUE CROSS/BLUE SHIELD | Admitting: Neurology

## 2018-01-10 NOTE — Telephone Encounter (Signed)
No showed new patient appointment. 

## 2018-01-11 ENCOUNTER — Encounter: Payer: Self-pay | Admitting: Neurology

## 2018-01-23 DIAGNOSIS — J322 Chronic ethmoidal sinusitis: Secondary | ICD-10-CM | POA: Diagnosis not present

## 2018-01-23 DIAGNOSIS — J321 Chronic frontal sinusitis: Secondary | ICD-10-CM | POA: Diagnosis not present

## 2018-01-23 DIAGNOSIS — J320 Chronic maxillary sinusitis: Secondary | ICD-10-CM | POA: Diagnosis not present

## 2018-01-23 DIAGNOSIS — J323 Chronic sphenoidal sinusitis: Secondary | ICD-10-CM | POA: Diagnosis not present

## 2018-02-01 DIAGNOSIS — J180 Bronchopneumonia, unspecified organism: Secondary | ICD-10-CM | POA: Diagnosis not present

## 2018-02-05 ENCOUNTER — Ambulatory Visit: Payer: BLUE CROSS/BLUE SHIELD | Admitting: Neurology

## 2018-02-20 DIAGNOSIS — J321 Chronic frontal sinusitis: Secondary | ICD-10-CM | POA: Diagnosis not present

## 2018-02-20 DIAGNOSIS — H6523 Chronic serous otitis media, bilateral: Secondary | ICD-10-CM | POA: Diagnosis not present

## 2018-02-20 DIAGNOSIS — J322 Chronic ethmoidal sinusitis: Secondary | ICD-10-CM | POA: Diagnosis not present

## 2018-02-20 DIAGNOSIS — J323 Chronic sphenoidal sinusitis: Secondary | ICD-10-CM | POA: Diagnosis not present

## 2018-02-20 DIAGNOSIS — J320 Chronic maxillary sinusitis: Secondary | ICD-10-CM | POA: Diagnosis not present

## 2018-02-20 DIAGNOSIS — H6983 Other specified disorders of Eustachian tube, bilateral: Secondary | ICD-10-CM | POA: Diagnosis not present

## 2018-02-21 ENCOUNTER — Emergency Department (HOSPITAL_COMMUNITY)
Admission: EM | Admit: 2018-02-21 | Discharge: 2018-02-22 | Disposition: A | Discharge status: Home / Self Care | Payer: No Typology Code available for payment source | Attending: Emergency Medicine | Admitting: Emergency Medicine

## 2018-02-21 ENCOUNTER — Encounter (HOSPITAL_COMMUNITY): Payer: Self-pay | Admitting: Internal Medicine

## 2018-02-21 ENCOUNTER — Other Ambulatory Visit: Payer: Self-pay

## 2018-02-21 ENCOUNTER — Emergency Department (HOSPITAL_COMMUNITY): Payer: No Typology Code available for payment source

## 2018-02-21 DIAGNOSIS — Y9389 Activity, other specified: Secondary | ICD-10-CM | POA: Insufficient documentation

## 2018-02-21 DIAGNOSIS — S61012A Laceration without foreign body of left thumb without damage to nail, initial encounter: Secondary | ICD-10-CM

## 2018-02-21 DIAGNOSIS — Y999 Unspecified external cause status: Secondary | ICD-10-CM | POA: Insufficient documentation

## 2018-02-21 DIAGNOSIS — W25XXXA Contact with sharp glass, initial encounter: Secondary | ICD-10-CM | POA: Diagnosis not present

## 2018-02-21 DIAGNOSIS — Y92511 Restaurant or cafe as the place of occurrence of the external cause: Secondary | ICD-10-CM | POA: Insufficient documentation

## 2018-02-21 NOTE — ED Provider Notes (Signed)
I saw and evaluated the patient, reviewed the resident's note and I agree with the findings and plan.  EKG: None 22 year old male here with thumb laceration.  On exam with superficial in nature.  No tendon involvement.  Neurovascular intact.  No foreign body on x-rays.  Patient to have Dermabond placed.   Lorre NickAllen, Tzippy Testerman, MD 02/21/18 2253

## 2018-02-21 NOTE — ED Provider Notes (Signed)
[facility]-EMERGENCY DEPT Provider Note   CSN: [id number] Arrival date & time: 02/21/18  2047     History   Chief Complaint Chief Complaint  Patient presents with  . Laceration    HPI Nicholas TURRELL is a 22 y.o. male with no PMH presenting after cutting his left thumb with the stem of a wine glass at work at [employer]. He states the stem broke off and stabbed upward into his thumb.   HPI  History reviewed. No pertinent past medical history.  There are no active problems to display for this patient.       Home Medications    Prior to Admission medications   Not on File    Family History No family history on file.  Social History Social History   Tobacco Use  . Smoking status: Not on file  Substance Use Topics  . Alcohol use: Not on file  . Drug use: Not on file     Allergies   Patient has no allergy information on record.   Review of Systems Review of Systems Review of systems reviewed and are negative for acute change, except as noted in the HPI.   Physical Exam Updated Vital Signs BP (!) 145/87 (BP Location: Right Arm)   Pulse 97   Temp 97.9 F (36.6 C) (Oral)   Resp 19   Ht 6' (1.829 m)   Wt 104.3 kg   SpO2 99%   BMI 31.19 kg/m   Physical Exam Constitutional:      General: He is not in acute distress.    Appearance: Normal appearance. He is normal weight.  HENT:     Head: Normocephalic and atraumatic.  Neck:     Musculoskeletal: Normal range of motion.  Cardiovascular:     Rate and Rhythm: Normal rate.  Pulmonary:     Effort: Pulmonary effort is normal.  Musculoskeletal: Normal range of motion.     Right hand: He exhibits laceration.  Skin:    General: Skin is warm and dry.     Comments: Anterior 1.25cm superficial lac without bleeding or foreign body  Neurological:     Mental Status: He is alert and oriented to person, place, and time.     Sensory: No sensory deficit.  Psychiatric:        Mood and  Affect: Mood normal.        Behavior: Behavior normal.        Thought Content: Thought content normal.      ED Treatments / Results  Labs (all labs ordered are listed, but only abnormal results are displayed) Labs Reviewed - No data to display  EKG None  Radiology Dg Finger Thumb Left  Result Date: 02/21/2018 CLINICAL DATA:  Glass in thumb, laceration EXAM: LEFT THUMB 2+V COMPARISON:  None. FINDINGS: There is no evidence of fracture or dislocation. There is no evidence of arthropathy or other focal bone abnormality. Soft tissues are unremarkable IMPRESSION: Negative. Electronically Signed   By: Jasmine Pang M.D.   On: 02/21/2018 22:30    Procedures Procedures (including critical care time)  Medications Ordered in ED Medications - No data to display   Initial Impression / Assessment and Plan / ED Course  I have reviewed the triage vital signs and the nursing notes.  Pertinent labs & imaging results that were available during my care of the patient were reviewed by me and considered in my medical decision making (see chart for details).  Clinical Course  as of Feb 22 2304  Thu Feb 21, 2018  2151 Left thumb lac with stem of wine glass. Xray ordered.    [JS]    Clinical Course User Index [JS] Airyonna Franklyn A, DO    Xray thumb does not show foreign body or glass. Laceration is 1.25cm in length on anterior distal thumb, superficial not necessitating sutures. Wound washed thoroughly and approximated with dermabond. Return precautions given in case of symptoms of infection.   Final Clinical Impressions(s) / ED Diagnoses   Final diagnoses:  Laceration of left thumb without foreign body without damage to nail, initial encounter    ED Discharge Orders    None       Versie StarksSeawell, Marlen Mollica A, DO 02/21/18 2306    Lorre NickAllen, Anthony, MD 02/22/18 1958

## 2018-02-21 NOTE — ED Notes (Signed)
Pt. Requested "something for his anxiety" prior to cleaning his wound. Will notify MD.

## 2018-02-21 NOTE — ED Triage Notes (Signed)
Pt to ED by POV with c/o of thumb laceration. Pt stats that he was carrying two glasses of wine at work when he hit the table and they shattered and cut his left thumb. Pt is A&O x4 and ambulatory. Pt does take Aderaol daily.

## 2018-02-22 ENCOUNTER — Encounter (HOSPITAL_BASED_OUTPATIENT_CLINIC_OR_DEPARTMENT_OTHER): Payer: Self-pay | Admitting: Otolaryngology

## 2018-02-22 NOTE — ED Notes (Signed)
Unable to start IV at this time, Ultrasound on going.

## 2018-02-27 DIAGNOSIS — Z23 Encounter for immunization: Secondary | ICD-10-CM | POA: Diagnosis not present

## 2018-02-27 DIAGNOSIS — Z202 Contact with and (suspected) exposure to infections with a predominantly sexual mode of transmission: Secondary | ICD-10-CM | POA: Diagnosis not present

## 2018-03-21 DIAGNOSIS — F902 Attention-deficit hyperactivity disorder, combined type: Secondary | ICD-10-CM | POA: Diagnosis not present

## 2018-03-21 DIAGNOSIS — Z79899 Other long term (current) drug therapy: Secondary | ICD-10-CM | POA: Diagnosis not present

## 2018-03-21 DIAGNOSIS — Z87820 Personal history of traumatic brain injury: Secondary | ICD-10-CM | POA: Diagnosis not present

## 2018-04-10 DIAGNOSIS — G478 Other sleep disorders: Secondary | ICD-10-CM | POA: Diagnosis not present

## 2018-04-10 DIAGNOSIS — G4719 Other hypersomnia: Secondary | ICD-10-CM | POA: Diagnosis not present

## 2018-04-10 DIAGNOSIS — F515 Nightmare disorder: Secondary | ICD-10-CM | POA: Diagnosis not present

## 2018-04-10 DIAGNOSIS — R0683 Snoring: Secondary | ICD-10-CM | POA: Diagnosis not present

## 2018-04-20 DIAGNOSIS — J069 Acute upper respiratory infection, unspecified: Secondary | ICD-10-CM | POA: Diagnosis not present

## 2018-05-13 DIAGNOSIS — J329 Chronic sinusitis, unspecified: Secondary | ICD-10-CM | POA: Diagnosis not present

## 2018-05-13 DIAGNOSIS — J40 Bronchitis, not specified as acute or chronic: Secondary | ICD-10-CM | POA: Diagnosis not present

## 2018-05-13 DIAGNOSIS — R062 Wheezing: Secondary | ICD-10-CM | POA: Diagnosis not present

## 2018-05-13 DIAGNOSIS — R110 Nausea: Secondary | ICD-10-CM | POA: Diagnosis not present

## 2018-05-28 DIAGNOSIS — Z79899 Other long term (current) drug therapy: Secondary | ICD-10-CM | POA: Diagnosis not present

## 2018-05-28 DIAGNOSIS — F902 Attention-deficit hyperactivity disorder, combined type: Secondary | ICD-10-CM | POA: Diagnosis not present

## 2018-05-28 DIAGNOSIS — Z87820 Personal history of traumatic brain injury: Secondary | ICD-10-CM | POA: Diagnosis not present

## 2018-09-12 DIAGNOSIS — M25511 Pain in right shoulder: Secondary | ICD-10-CM | POA: Diagnosis not present

## 2018-09-12 DIAGNOSIS — M25311 Other instability, right shoulder: Secondary | ICD-10-CM | POA: Diagnosis not present

## 2018-09-20 DIAGNOSIS — S060X0A Concussion without loss of consciousness, initial encounter: Secondary | ICD-10-CM | POA: Diagnosis not present

## 2018-09-20 DIAGNOSIS — X58XXXA Exposure to other specified factors, initial encounter: Secondary | ICD-10-CM | POA: Diagnosis not present

## 2018-09-20 DIAGNOSIS — S0990XA Unspecified injury of head, initial encounter: Secondary | ICD-10-CM | POA: Diagnosis not present

## 2018-09-23 DIAGNOSIS — M25511 Pain in right shoulder: Secondary | ICD-10-CM | POA: Diagnosis not present

## 2018-09-23 DIAGNOSIS — M25311 Other instability, right shoulder: Secondary | ICD-10-CM | POA: Diagnosis not present

## 2018-09-24 DIAGNOSIS — F902 Attention-deficit hyperactivity disorder, combined type: Secondary | ICD-10-CM | POA: Diagnosis not present

## 2018-09-24 DIAGNOSIS — Z87820 Personal history of traumatic brain injury: Secondary | ICD-10-CM | POA: Diagnosis not present

## 2018-10-09 DIAGNOSIS — H66012 Acute suppurative otitis media with spontaneous rupture of ear drum, left ear: Secondary | ICD-10-CM | POA: Diagnosis not present

## 2018-10-09 DIAGNOSIS — H6982 Other specified disorders of Eustachian tube, left ear: Secondary | ICD-10-CM | POA: Diagnosis not present

## 2018-10-09 DIAGNOSIS — H9012 Conductive hearing loss, unilateral, left ear, with unrestricted hearing on the contralateral side: Secondary | ICD-10-CM | POA: Diagnosis not present

## 2018-10-29 DIAGNOSIS — H66012 Acute suppurative otitis media with spontaneous rupture of ear drum, left ear: Secondary | ICD-10-CM | POA: Diagnosis not present

## 2018-10-29 DIAGNOSIS — H900 Conductive hearing loss, bilateral: Secondary | ICD-10-CM | POA: Diagnosis not present

## 2018-11-09 DIAGNOSIS — M9903 Segmental and somatic dysfunction of lumbar region: Secondary | ICD-10-CM | POA: Diagnosis not present

## 2018-11-09 DIAGNOSIS — M9904 Segmental and somatic dysfunction of sacral region: Secondary | ICD-10-CM | POA: Diagnosis not present

## 2018-11-09 DIAGNOSIS — M9902 Segmental and somatic dysfunction of thoracic region: Secondary | ICD-10-CM | POA: Diagnosis not present

## 2018-11-09 DIAGNOSIS — M9906 Segmental and somatic dysfunction of lower extremity: Secondary | ICD-10-CM | POA: Diagnosis not present

## 2018-11-15 DIAGNOSIS — G4733 Obstructive sleep apnea (adult) (pediatric): Secondary | ICD-10-CM | POA: Diagnosis not present

## 2018-11-15 DIAGNOSIS — G4700 Insomnia, unspecified: Secondary | ICD-10-CM | POA: Diagnosis not present

## 2018-11-15 DIAGNOSIS — R0683 Snoring: Secondary | ICD-10-CM | POA: Diagnosis not present

## 2018-11-15 DIAGNOSIS — F320 Major depressive disorder, single episode, mild: Secondary | ICD-10-CM | POA: Diagnosis not present

## 2018-12-10 DIAGNOSIS — R05 Cough: Secondary | ICD-10-CM | POA: Diagnosis not present

## 2018-12-10 DIAGNOSIS — J189 Pneumonia, unspecified organism: Secondary | ICD-10-CM | POA: Diagnosis not present

## 2018-12-10 DIAGNOSIS — Z20828 Contact with and (suspected) exposure to other viral communicable diseases: Secondary | ICD-10-CM | POA: Diagnosis not present

## 2018-12-15 DIAGNOSIS — R0600 Dyspnea, unspecified: Secondary | ICD-10-CM | POA: Diagnosis not present

## 2018-12-15 DIAGNOSIS — J22 Unspecified acute lower respiratory infection: Secondary | ICD-10-CM | POA: Diagnosis not present

## 2018-12-15 DIAGNOSIS — R05 Cough: Secondary | ICD-10-CM | POA: Diagnosis not present

## 2018-12-20 DIAGNOSIS — H66011 Acute suppurative otitis media with spontaneous rupture of ear drum, right ear: Secondary | ICD-10-CM | POA: Diagnosis not present

## 2018-12-24 DIAGNOSIS — Z03818 Encounter for observation for suspected exposure to other biological agents ruled out: Secondary | ICD-10-CM | POA: Diagnosis not present

## 2018-12-24 DIAGNOSIS — J069 Acute upper respiratory infection, unspecified: Secondary | ICD-10-CM | POA: Diagnosis not present

## 2018-12-24 DIAGNOSIS — J4521 Mild intermittent asthma with (acute) exacerbation: Secondary | ICD-10-CM | POA: Diagnosis not present

## 2018-12-24 DIAGNOSIS — K529 Noninfective gastroenteritis and colitis, unspecified: Secondary | ICD-10-CM | POA: Diagnosis not present

## 2018-12-30 DIAGNOSIS — F902 Attention-deficit hyperactivity disorder, combined type: Secondary | ICD-10-CM | POA: Diagnosis not present

## 2018-12-30 DIAGNOSIS — Z79899 Other long term (current) drug therapy: Secondary | ICD-10-CM | POA: Diagnosis not present

## 2018-12-30 DIAGNOSIS — Z87820 Personal history of traumatic brain injury: Secondary | ICD-10-CM | POA: Diagnosis not present

## 2019-01-08 DIAGNOSIS — G933 Postviral fatigue syndrome: Secondary | ICD-10-CM | POA: Diagnosis not present

## 2019-01-08 DIAGNOSIS — J4521 Mild intermittent asthma with (acute) exacerbation: Secondary | ICD-10-CM | POA: Diagnosis not present

## 2020-03-30 IMAGING — CT CT MAXILLOFACIAL W/O CM
1 series · 15 of 30 positions shown, 19 images · non-contrast
Comparison: None.

CLINICAL DATA: Chronic sinusitis.  Nasal polyp.

EXAM:
CT MAXILLOFACIAL WITHOUT CONTRAST
TECHNIQUE: Multidetector CT images of the paranasal sinuses were obtained using
the standard protocol without intravenous contrast.

[Series 4: soft tissue · axial · 0.50mm/px · z∈[-204,-34]mm · 15 of 184 slices shown, 19 images]
[im 7/184  brain]
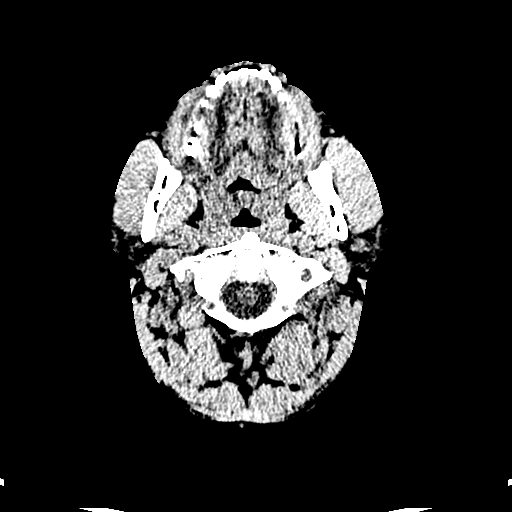
[im 7/184  bone]
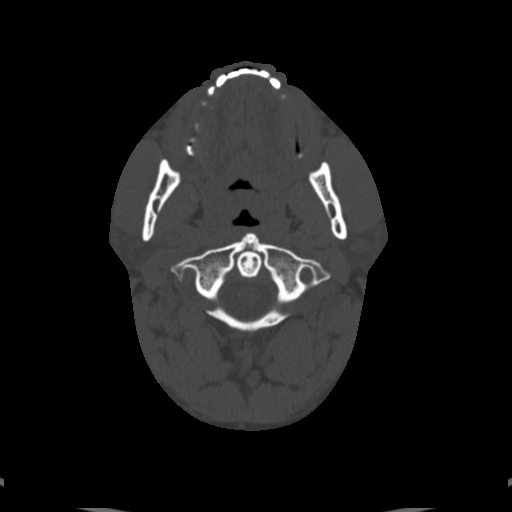
[im 19/184  bone]
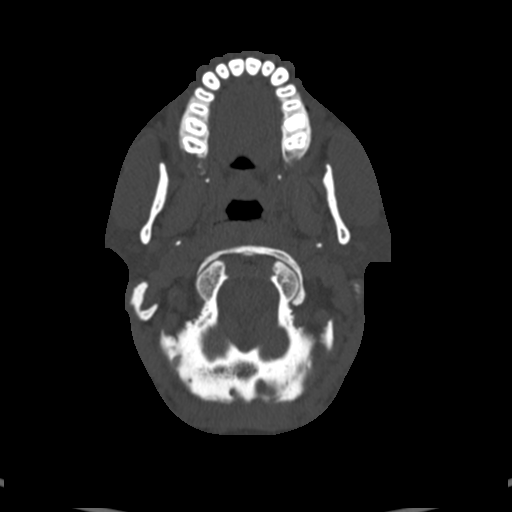
[im 32/184  bone]
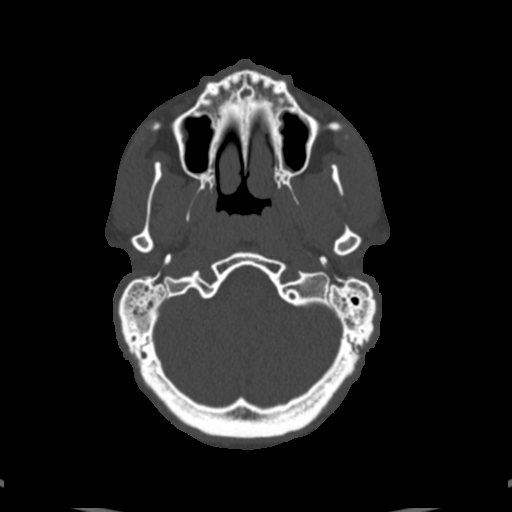
[im 45/184  bone]
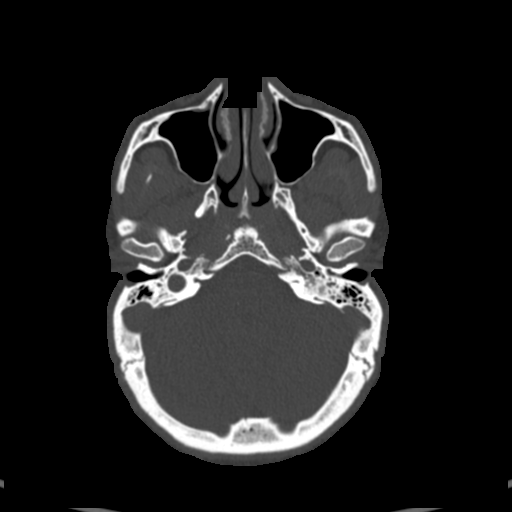
[im 57/184  brain]
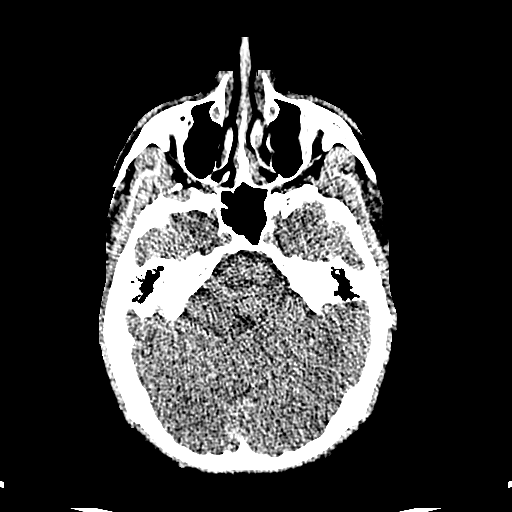
[im 57/184  bone]
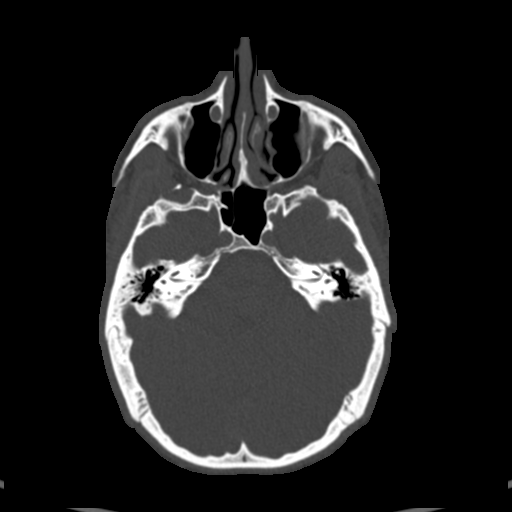
[im 70/184  bone]
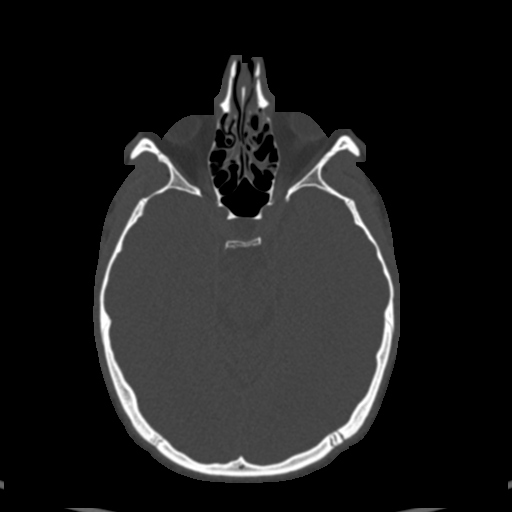
[im 83/184  bone]
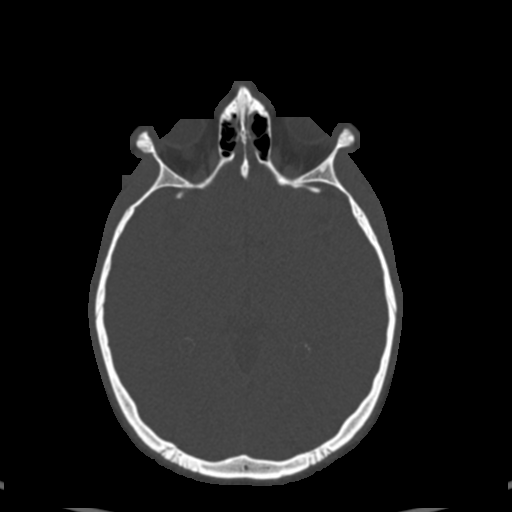
[im 95/184  bone]
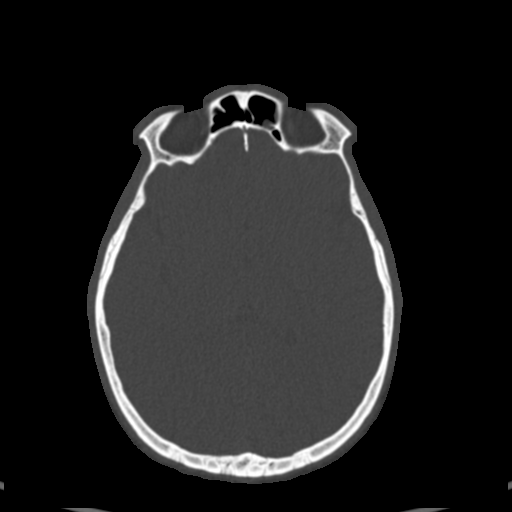
[im 101/184  brain]
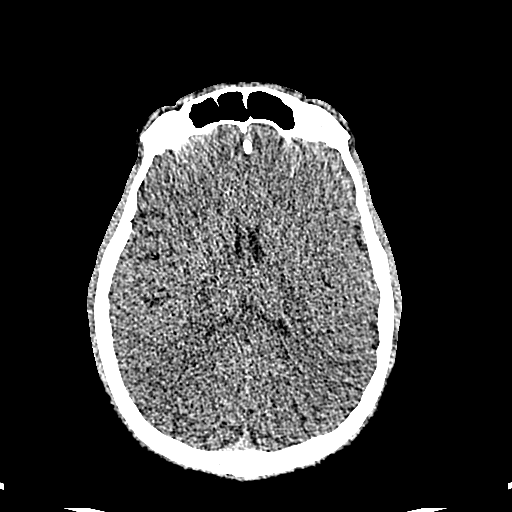
[im 101/184  bone]
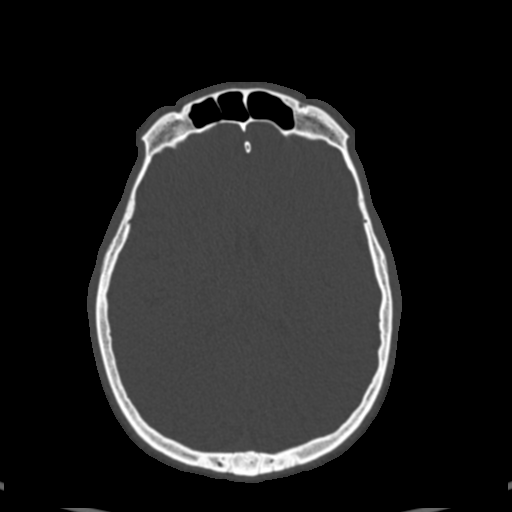
[im 114/184  bone]
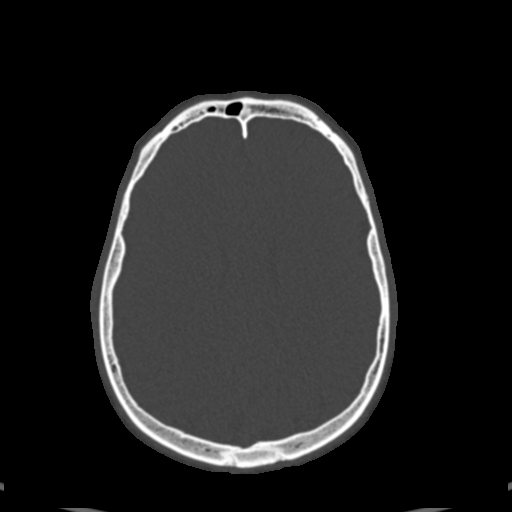
[im 127/184  bone]
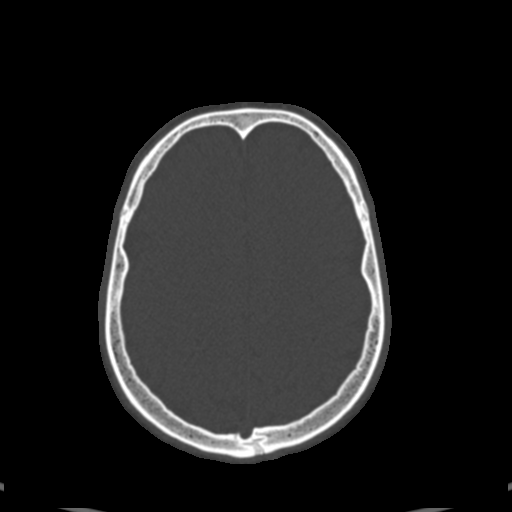
[im 139/184  bone]
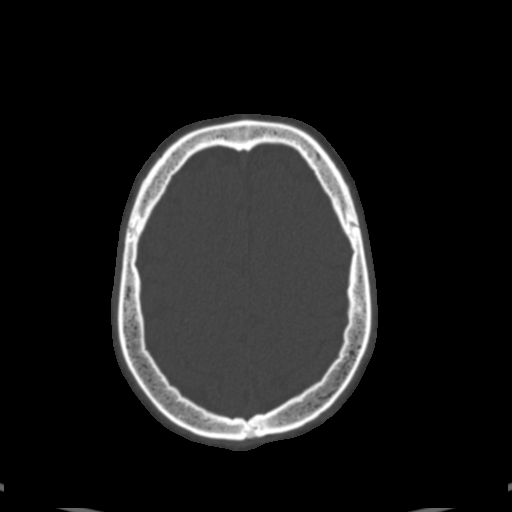
[im 152/184  brain]
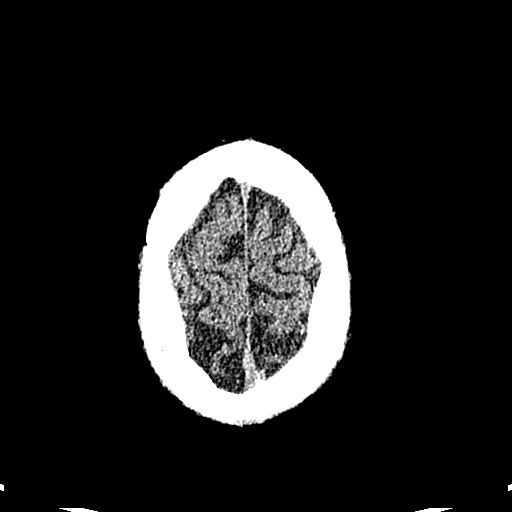
[im 152/184  bone]
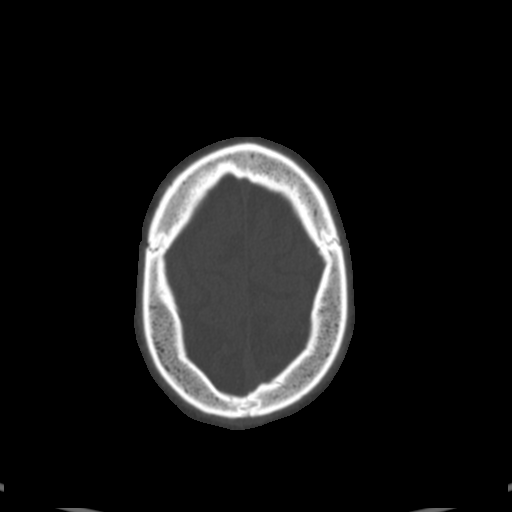
[im 165/184  bone]
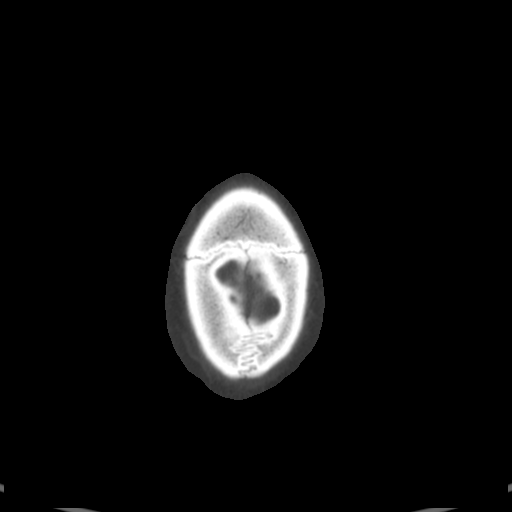
[im 177/184  bone]
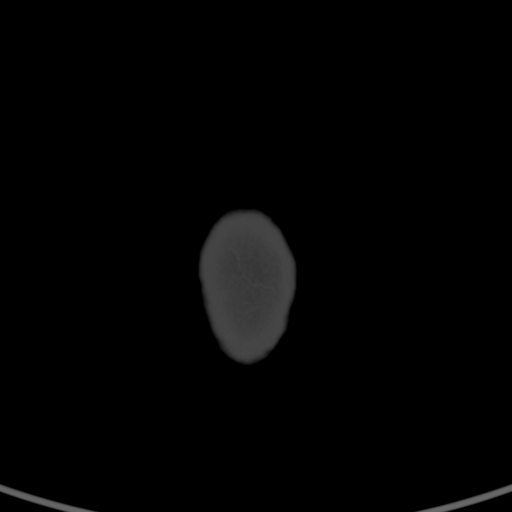

[15 of 30 positions shown; findings below may reference images not displayed]

FINDINGS: Paranasal sinuses:

Frontal: Mildly lobular mucosal thickening inferiorly in the left
frontal sinus extending into the region of the frontal recess. Clear
right frontal sinus.

Ethmoid: Mild anterior and posterior ethmoid air cell mucosal
thickening bilaterally, greater on the left.

Maxillary: Minimal right and mild left maxillary sinus mucosal
thickening. No fluid.

Sphenoid: Minimal mucosal thickening anteriorly in both sphenoid
sinuses with ostial opacification bilaterally and left greater than
right sphenoethmoidal recess opacification.

Right ostiomeatal unit: Patent.

Left ostiomeatal unit: Opacification of the maxillary sinus ostium
and proximal infundibulum.

Nasal passages: Mildly asymmetric soft tissue medial to the left
middle and superior turbinates which may relate to the provided
history of a nasal polyp. 3 mm rightward nasal septal deviation.
Opacified, small right concha bullosa.

Anatomy: Slight pneumatization superior to the anterior ethmoid
notch on the left. Symmetric and intact olfactory grooves and fovea
ethmoidalis, Keros I. Sellar sphenoid pneumatization pattern. No
dehiscence of carotid or optic canals. No onodi cell.

Other: The mastoid air cells and middle ear cavities are clear. The
orbits and brain are unremarkable.
IMPRESSION: 1. Mild scattered paranasal sinus mucosal thickening as above. No
fluid.
2. Possible small left nasal cavity polyp.
